# Patient Record
Sex: Male | Born: 1975 | Race: White | Hispanic: No | Smoking: Current every day smoker
Health system: Southern US, Community
[De-identification: ages and names within clinical notes are randomized; demographics above are authoritative.]

## PROBLEM LIST (undated history)

## (undated) DIAGNOSIS — R569 Unspecified convulsions: Secondary | ICD-10-CM

## (undated) DIAGNOSIS — K219 Gastro-esophageal reflux disease without esophagitis: Secondary | ICD-10-CM

## (undated) DIAGNOSIS — F32A Depression, unspecified: Secondary | ICD-10-CM

## (undated) DIAGNOSIS — Q400 Congenital hypertrophic pyloric stenosis: Secondary | ICD-10-CM

## (undated) DIAGNOSIS — T7840XA Allergy, unspecified, initial encounter: Secondary | ICD-10-CM

## (undated) DIAGNOSIS — G8929 Other chronic pain: Secondary | ICD-10-CM

## (undated) DIAGNOSIS — I10 Essential (primary) hypertension: Secondary | ICD-10-CM

## (undated) DIAGNOSIS — Z8719 Personal history of other diseases of the digestive system: Secondary | ICD-10-CM

## (undated) DIAGNOSIS — M1990 Unspecified osteoarthritis, unspecified site: Secondary | ICD-10-CM

## (undated) DIAGNOSIS — M549 Dorsalgia, unspecified: Secondary | ICD-10-CM

## (undated) DIAGNOSIS — F419 Anxiety disorder, unspecified: Secondary | ICD-10-CM

## (undated) DIAGNOSIS — M5430 Sciatica, unspecified side: Secondary | ICD-10-CM

## (undated) HISTORY — DX: Anxiety disorder, unspecified: F41.9

## (undated) HISTORY — PX: BACK SURGERY: SHX140

## (undated) HISTORY — PX: COLONOSCOPY WITH ESOPHAGOGASTRODUODENOSCOPY (EGD): SHX5779

## (undated) HISTORY — DX: Depression, unspecified: F32.A

## (undated) HISTORY — PX: APPENDECTOMY: SHX54

## (undated) HISTORY — DX: Other chronic pain: G89.29

## (undated) HISTORY — DX: Essential (primary) hypertension: I10

## (undated) HISTORY — DX: Congenital hypertrophic pyloric stenosis: Q40.0

## (undated) HISTORY — PX: OTHER SURGICAL HISTORY: SHX169

## (undated) HISTORY — DX: Unspecified osteoarthritis, unspecified site: M19.90

## (undated) HISTORY — DX: Allergy, unspecified, initial encounter: T78.40XA

## (undated) HISTORY — DX: Unspecified convulsions: R56.9

## (undated) HISTORY — PX: COLONOSCOPY: SHX174

## (undated) HISTORY — PX: UPPER GASTROINTESTINAL ENDOSCOPY: SHX188

## (undated) HISTORY — DX: Sciatica, unspecified side: M54.30

---

## 1998-11-06 ENCOUNTER — Encounter: Admission: RE | Admit: 1998-11-06 | Discharge: 1998-11-06 | Payer: Self-pay | Admitting: Family Medicine

## 1999-01-01 ENCOUNTER — Encounter: Admission: RE | Admit: 1999-01-01 | Discharge: 1999-01-01 | Payer: Self-pay | Admitting: Sports Medicine

## 1999-07-30 ENCOUNTER — Other Ambulatory Visit: Admission: RE | Admit: 1999-07-30 | Discharge: 1999-07-30 | Payer: Self-pay | Admitting: Gastroenterology

## 1999-07-30 ENCOUNTER — Encounter (INDEPENDENT_AMBULATORY_CARE_PROVIDER_SITE_OTHER): Payer: Self-pay | Admitting: Specialist

## 1999-09-15 ENCOUNTER — Ambulatory Visit (HOSPITAL_COMMUNITY): Admission: RE | Admit: 1999-09-15 | Discharge: 1999-09-15 | Payer: Self-pay | Admitting: Gastroenterology

## 1999-09-15 ENCOUNTER — Encounter: Payer: Self-pay | Admitting: Gastroenterology

## 2000-09-06 ENCOUNTER — Encounter: Admission: RE | Admit: 2000-09-06 | Discharge: 2000-09-06 | Payer: Self-pay | Admitting: Family Medicine

## 2000-10-04 ENCOUNTER — Emergency Department (HOSPITAL_COMMUNITY): Admission: EM | Admit: 2000-10-04 | Discharge: 2000-10-04 | Payer: Self-pay | Admitting: Emergency Medicine

## 2000-10-04 ENCOUNTER — Encounter: Admission: RE | Admit: 2000-10-04 | Discharge: 2000-10-04 | Payer: Self-pay | Admitting: Family Medicine

## 2001-01-27 ENCOUNTER — Encounter: Admission: RE | Admit: 2001-01-27 | Discharge: 2001-01-27 | Payer: Self-pay | Admitting: Family Medicine

## 2001-02-15 ENCOUNTER — Encounter: Admission: RE | Admit: 2001-02-15 | Discharge: 2001-02-15 | Payer: Self-pay | Admitting: Sports Medicine

## 2001-02-22 ENCOUNTER — Encounter: Admission: RE | Admit: 2001-02-22 | Discharge: 2001-02-22 | Payer: Self-pay | Admitting: Sports Medicine

## 2001-03-14 ENCOUNTER — Encounter: Admission: RE | Admit: 2001-03-14 | Discharge: 2001-03-14 | Payer: Self-pay | Admitting: Family Medicine

## 2001-08-26 ENCOUNTER — Emergency Department (HOSPITAL_COMMUNITY): Admission: EM | Admit: 2001-08-26 | Discharge: 2001-08-26 | Payer: Self-pay | Admitting: Emergency Medicine

## 2001-08-26 ENCOUNTER — Encounter: Payer: Self-pay | Admitting: Emergency Medicine

## 2001-09-08 ENCOUNTER — Encounter: Payer: Self-pay | Admitting: Emergency Medicine

## 2001-09-08 ENCOUNTER — Emergency Department (HOSPITAL_COMMUNITY): Admission: EM | Admit: 2001-09-08 | Discharge: 2001-09-08 | Payer: Self-pay | Admitting: Emergency Medicine

## 2002-03-21 ENCOUNTER — Encounter: Admission: RE | Admit: 2002-03-21 | Discharge: 2002-03-21 | Payer: Self-pay | Admitting: Family Medicine

## 2002-04-07 ENCOUNTER — Encounter: Admission: RE | Admit: 2002-04-07 | Discharge: 2002-04-07 | Payer: Self-pay | Admitting: Family Medicine

## 2002-04-16 ENCOUNTER — Emergency Department (HOSPITAL_COMMUNITY): Admission: EM | Admit: 2002-04-16 | Discharge: 2002-04-16 | Payer: Self-pay | Admitting: Emergency Medicine

## 2002-04-17 ENCOUNTER — Encounter: Admission: RE | Admit: 2002-04-17 | Discharge: 2002-04-17 | Payer: Self-pay | Admitting: Family Medicine

## 2002-04-24 ENCOUNTER — Encounter: Admission: RE | Admit: 2002-04-24 | Discharge: 2002-04-24 | Payer: Self-pay | Admitting: Family Medicine

## 2002-05-02 ENCOUNTER — Encounter: Admission: RE | Admit: 2002-05-02 | Discharge: 2002-05-02 | Payer: Self-pay | Admitting: Family Medicine

## 2002-06-06 ENCOUNTER — Encounter: Payer: Self-pay | Admitting: Emergency Medicine

## 2002-06-06 ENCOUNTER — Emergency Department (HOSPITAL_COMMUNITY): Admission: EM | Admit: 2002-06-06 | Discharge: 2002-06-06 | Payer: Self-pay | Admitting: Emergency Medicine

## 2002-10-12 ENCOUNTER — Emergency Department (HOSPITAL_COMMUNITY): Admission: EM | Admit: 2002-10-12 | Discharge: 2002-10-12 | Payer: Self-pay | Admitting: Emergency Medicine

## 2003-02-10 ENCOUNTER — Encounter: Payer: Self-pay | Admitting: Emergency Medicine

## 2003-02-10 ENCOUNTER — Emergency Department (HOSPITAL_COMMUNITY): Admission: EM | Admit: 2003-02-10 | Discharge: 2003-02-10 | Payer: Self-pay

## 2003-02-13 ENCOUNTER — Encounter: Admission: RE | Admit: 2003-02-13 | Discharge: 2003-02-13 | Payer: Self-pay | Admitting: Family Medicine

## 2003-03-02 ENCOUNTER — Encounter: Admission: RE | Admit: 2003-03-02 | Discharge: 2003-03-02 | Payer: Self-pay | Admitting: Family Medicine

## 2003-04-02 ENCOUNTER — Encounter: Admission: RE | Admit: 2003-04-02 | Discharge: 2003-04-02 | Payer: Self-pay | Admitting: Family Medicine

## 2003-04-13 ENCOUNTER — Encounter: Payer: Self-pay | Admitting: *Deleted

## 2003-04-13 ENCOUNTER — Encounter: Admission: RE | Admit: 2003-04-13 | Discharge: 2003-04-13 | Payer: Self-pay | Admitting: *Deleted

## 2003-05-25 ENCOUNTER — Encounter: Payer: Self-pay | Admitting: Neurosurgery

## 2003-05-25 ENCOUNTER — Ambulatory Visit (HOSPITAL_COMMUNITY): Admission: RE | Admit: 2003-05-25 | Discharge: 2003-05-25 | Payer: Self-pay | Admitting: Neurosurgery

## 2003-06-01 ENCOUNTER — Encounter: Admission: RE | Admit: 2003-06-01 | Discharge: 2003-06-01 | Payer: Self-pay | Admitting: Family Medicine

## 2003-06-28 ENCOUNTER — Encounter: Payer: Self-pay | Admitting: Neurosurgery

## 2003-06-28 ENCOUNTER — Encounter: Payer: Self-pay | Admitting: Diagnostic Radiology

## 2003-06-28 ENCOUNTER — Encounter: Admission: RE | Admit: 2003-06-28 | Discharge: 2003-06-28 | Payer: Self-pay | Admitting: Neurosurgery

## 2005-03-27 ENCOUNTER — Ambulatory Visit: Payer: Self-pay | Admitting: Sports Medicine

## 2005-04-24 ENCOUNTER — Ambulatory Visit: Payer: Self-pay | Admitting: Family Medicine

## 2005-05-15 ENCOUNTER — Ambulatory Visit: Payer: Self-pay | Admitting: Family Medicine

## 2005-06-12 ENCOUNTER — Encounter
Admission: RE | Admit: 2005-06-12 | Discharge: 2005-09-10 | Payer: Self-pay | Admitting: Physical Medicine & Rehabilitation

## 2005-06-12 ENCOUNTER — Ambulatory Visit: Payer: Self-pay | Admitting: Physical Medicine & Rehabilitation

## 2005-06-16 ENCOUNTER — Ambulatory Visit: Payer: Self-pay | Admitting: Family Medicine

## 2005-07-14 ENCOUNTER — Ambulatory Visit: Payer: Self-pay | Admitting: Family Medicine

## 2005-08-06 ENCOUNTER — Ambulatory Visit: Payer: Self-pay | Admitting: Family Medicine

## 2005-09-07 ENCOUNTER — Ambulatory Visit: Payer: Self-pay | Admitting: Family Medicine

## 2005-10-08 ENCOUNTER — Ambulatory Visit: Payer: Self-pay | Admitting: Family Medicine

## 2005-12-08 ENCOUNTER — Ambulatory Visit: Payer: Self-pay | Admitting: Family Medicine

## 2006-05-19 ENCOUNTER — Emergency Department (HOSPITAL_COMMUNITY): Admission: EM | Admit: 2006-05-19 | Discharge: 2006-05-19 | Payer: Self-pay | Admitting: Emergency Medicine

## 2006-06-01 ENCOUNTER — Ambulatory Visit: Payer: Self-pay | Admitting: Family Medicine

## 2006-06-13 ENCOUNTER — Emergency Department: Payer: Self-pay | Admitting: Emergency Medicine

## 2006-06-17 ENCOUNTER — Emergency Department: Payer: Self-pay | Admitting: Internal Medicine

## 2006-07-22 ENCOUNTER — Ambulatory Visit: Payer: Self-pay | Admitting: Sports Medicine

## 2006-09-24 ENCOUNTER — Ambulatory Visit: Payer: Self-pay | Admitting: Family Medicine

## 2006-10-03 ENCOUNTER — Emergency Department (HOSPITAL_COMMUNITY): Admission: EM | Admit: 2006-10-03 | Discharge: 2006-10-03 | Payer: Self-pay | Admitting: Family Medicine

## 2006-10-06 ENCOUNTER — Ambulatory Visit: Payer: Self-pay | Admitting: Sports Medicine

## 2006-10-18 ENCOUNTER — Ambulatory Visit: Payer: Self-pay | Admitting: Family Medicine

## 2006-10-21 ENCOUNTER — Encounter (INDEPENDENT_AMBULATORY_CARE_PROVIDER_SITE_OTHER): Payer: Self-pay | Admitting: Family Medicine

## 2006-10-21 ENCOUNTER — Ambulatory Visit: Payer: Self-pay | Admitting: Family Medicine

## 2006-12-19 ENCOUNTER — Emergency Department (HOSPITAL_COMMUNITY): Admission: EM | Admit: 2006-12-19 | Discharge: 2006-12-19 | Payer: Self-pay | Admitting: Emergency Medicine

## 2007-01-05 ENCOUNTER — Emergency Department: Payer: Self-pay | Admitting: Emergency Medicine

## 2007-01-19 ENCOUNTER — Ambulatory Visit: Payer: Self-pay | Admitting: Family Medicine

## 2007-02-04 ENCOUNTER — Observation Stay (HOSPITAL_COMMUNITY): Admission: EM | Admit: 2007-02-04 | Discharge: 2007-02-05 | Payer: Self-pay | Admitting: Emergency Medicine

## 2007-02-04 ENCOUNTER — Ambulatory Visit: Payer: Self-pay | Admitting: Internal Medicine

## 2007-02-07 ENCOUNTER — Ambulatory Visit: Payer: Self-pay | Admitting: Family Medicine

## 2007-02-07 ENCOUNTER — Encounter (INDEPENDENT_AMBULATORY_CARE_PROVIDER_SITE_OTHER): Payer: Self-pay | Admitting: Family Medicine

## 2007-02-07 LAB — CONVERTED CEMR LAB
BUN: 17 mg/dL (ref 6–23)
CO2: 27 meq/L (ref 19–32)
Chloride: 102 meq/L (ref 96–112)
Creatinine, Ser: 0.98 mg/dL (ref 0.40–1.50)
Glucose, Bld: 93 mg/dL (ref 70–99)

## 2007-03-02 ENCOUNTER — Telehealth: Payer: Self-pay | Admitting: *Deleted

## 2007-03-10 ENCOUNTER — Telehealth: Payer: Self-pay | Admitting: Family Medicine

## 2007-03-21 ENCOUNTER — Telehealth: Payer: Self-pay | Admitting: *Deleted

## 2007-03-22 ENCOUNTER — Ambulatory Visit: Payer: Self-pay

## 2007-03-22 DIAGNOSIS — IMO0002 Reserved for concepts with insufficient information to code with codable children: Secondary | ICD-10-CM

## 2007-03-23 ENCOUNTER — Telehealth (INDEPENDENT_AMBULATORY_CARE_PROVIDER_SITE_OTHER): Payer: Self-pay | Admitting: Family Medicine

## 2007-04-21 ENCOUNTER — Encounter (INDEPENDENT_AMBULATORY_CARE_PROVIDER_SITE_OTHER): Payer: Self-pay | Admitting: Family Medicine

## 2007-04-21 ENCOUNTER — Ambulatory Visit: Payer: Self-pay | Admitting: Family Medicine

## 2007-04-21 DIAGNOSIS — R569 Unspecified convulsions: Secondary | ICD-10-CM | POA: Insufficient documentation

## 2007-04-21 LAB — CONVERTED CEMR LAB
Albumin: 5.2 g/dL (ref 3.5–5.2)
Alkaline Phosphatase: 56 units/L (ref 39–117)
BUN: 16 mg/dL (ref 6–23)
Calcium: 10.1 mg/dL (ref 8.4–10.5)
Creatinine, Ser: 0.93 mg/dL (ref 0.40–1.50)
Glucose, Bld: 90 mg/dL (ref 70–99)
Ketones, urine, test strip: NEGATIVE
Nitrite: NEGATIVE
Potassium: 4.2 meq/L (ref 3.5–5.3)
Urobilinogen, UA: 0.2
WBC Urine, dipstick: NEGATIVE

## 2007-04-22 ENCOUNTER — Telehealth: Payer: Self-pay | Admitting: *Deleted

## 2007-04-25 ENCOUNTER — Telehealth: Payer: Self-pay | Admitting: *Deleted

## 2007-04-26 ENCOUNTER — Ambulatory Visit (HOSPITAL_COMMUNITY): Admission: RE | Admit: 2007-04-26 | Discharge: 2007-04-26 | Payer: Self-pay | Admitting: Family Medicine

## 2007-04-26 ENCOUNTER — Encounter (INDEPENDENT_AMBULATORY_CARE_PROVIDER_SITE_OTHER): Payer: Self-pay | Admitting: Family Medicine

## 2007-05-02 ENCOUNTER — Telehealth: Payer: Self-pay | Admitting: *Deleted

## 2007-05-20 ENCOUNTER — Telehealth (INDEPENDENT_AMBULATORY_CARE_PROVIDER_SITE_OTHER): Payer: Self-pay | Admitting: Family Medicine

## 2007-05-26 ENCOUNTER — Ambulatory Visit: Payer: Self-pay | Admitting: Family Medicine

## 2007-06-21 ENCOUNTER — Telehealth: Payer: Self-pay | Admitting: *Deleted

## 2007-07-27 ENCOUNTER — Ambulatory Visit: Payer: Self-pay | Admitting: Family Medicine

## 2007-10-11 ENCOUNTER — Telehealth: Payer: Self-pay | Admitting: *Deleted

## 2007-10-12 ENCOUNTER — Telehealth (INDEPENDENT_AMBULATORY_CARE_PROVIDER_SITE_OTHER): Payer: Self-pay | Admitting: *Deleted

## 2007-10-12 ENCOUNTER — Ambulatory Visit: Payer: Self-pay | Admitting: Family Medicine

## 2007-10-23 ENCOUNTER — Emergency Department (HOSPITAL_COMMUNITY): Admission: EM | Admit: 2007-10-23 | Discharge: 2007-10-23 | Payer: Self-pay | Admitting: Emergency Medicine

## 2007-11-10 ENCOUNTER — Ambulatory Visit: Payer: Self-pay | Admitting: Family Medicine

## 2007-11-25 ENCOUNTER — Telehealth (INDEPENDENT_AMBULATORY_CARE_PROVIDER_SITE_OTHER): Payer: Self-pay | Admitting: *Deleted

## 2007-11-26 ENCOUNTER — Emergency Department (HOSPITAL_COMMUNITY): Admission: EM | Admit: 2007-11-26 | Discharge: 2007-11-26 | Payer: Self-pay | Admitting: Emergency Medicine

## 2007-12-08 ENCOUNTER — Emergency Department (HOSPITAL_COMMUNITY): Admission: EM | Admit: 2007-12-08 | Discharge: 2007-12-08 | Payer: Self-pay | Admitting: Family Medicine

## 2007-12-08 ENCOUNTER — Telehealth: Payer: Self-pay | Admitting: Psychology

## 2007-12-16 ENCOUNTER — Telehealth: Payer: Self-pay | Admitting: *Deleted

## 2007-12-19 ENCOUNTER — Ambulatory Visit: Payer: Self-pay | Admitting: Family Medicine

## 2008-01-02 ENCOUNTER — Ambulatory Visit: Payer: Self-pay | Admitting: Family Medicine

## 2008-01-09 ENCOUNTER — Ambulatory Visit: Payer: Self-pay | Admitting: Psychology

## 2008-01-09 DIAGNOSIS — F39 Unspecified mood [affective] disorder: Secondary | ICD-10-CM | POA: Insufficient documentation

## 2008-01-18 ENCOUNTER — Ambulatory Visit: Payer: Self-pay | Admitting: Family Medicine

## 2008-01-18 DIAGNOSIS — F17200 Nicotine dependence, unspecified, uncomplicated: Secondary | ICD-10-CM

## 2008-01-22 ENCOUNTER — Telehealth: Payer: Self-pay | Admitting: Family Medicine

## 2008-01-23 ENCOUNTER — Ambulatory Visit: Payer: Self-pay | Admitting: Psychology

## 2008-01-23 ENCOUNTER — Emergency Department (HOSPITAL_COMMUNITY): Admission: EM | Admit: 2008-01-23 | Discharge: 2008-01-23 | Payer: Self-pay | Admitting: Emergency Medicine

## 2008-01-23 ENCOUNTER — Telehealth (INDEPENDENT_AMBULATORY_CARE_PROVIDER_SITE_OTHER): Payer: Self-pay | Admitting: *Deleted

## 2008-02-03 ENCOUNTER — Ambulatory Visit: Payer: Self-pay | Admitting: Family Medicine

## 2008-02-09 ENCOUNTER — Encounter (INDEPENDENT_AMBULATORY_CARE_PROVIDER_SITE_OTHER): Payer: Self-pay | Admitting: Family Medicine

## 2008-02-15 ENCOUNTER — Telehealth: Payer: Self-pay | Admitting: Psychology

## 2008-02-29 ENCOUNTER — Ambulatory Visit: Payer: Self-pay | Admitting: Family Medicine

## 2008-03-08 ENCOUNTER — Ambulatory Visit: Payer: Self-pay | Admitting: Sports Medicine

## 2008-03-19 ENCOUNTER — Encounter (INDEPENDENT_AMBULATORY_CARE_PROVIDER_SITE_OTHER): Payer: Self-pay | Admitting: Family Medicine

## 2008-03-19 ENCOUNTER — Ambulatory Visit: Payer: Self-pay | Admitting: Family Medicine

## 2008-03-22 ENCOUNTER — Telehealth (INDEPENDENT_AMBULATORY_CARE_PROVIDER_SITE_OTHER): Payer: Self-pay | Admitting: Family Medicine

## 2008-03-22 ENCOUNTER — Ambulatory Visit: Payer: Self-pay | Admitting: Family Medicine

## 2008-03-30 ENCOUNTER — Telehealth (INDEPENDENT_AMBULATORY_CARE_PROVIDER_SITE_OTHER): Payer: Self-pay | Admitting: Family Medicine

## 2008-04-03 ENCOUNTER — Encounter (INDEPENDENT_AMBULATORY_CARE_PROVIDER_SITE_OTHER): Payer: Self-pay | Admitting: *Deleted

## 2008-04-18 ENCOUNTER — Encounter (INDEPENDENT_AMBULATORY_CARE_PROVIDER_SITE_OTHER): Payer: Self-pay | Admitting: Family Medicine

## 2008-04-18 ENCOUNTER — Ambulatory Visit: Payer: Self-pay | Admitting: Family Medicine

## 2008-04-18 ENCOUNTER — Ambulatory Visit (HOSPITAL_COMMUNITY): Admission: RE | Admit: 2008-04-18 | Discharge: 2008-04-18 | Payer: Self-pay | Admitting: Family Medicine

## 2008-04-18 LAB — CONVERTED CEMR LAB
Lymphs Abs: 1.7 10*3/uL (ref 0.7–4.0)
MCV: 90.2 fL (ref 78.0–100.0)
Monocytes Relative: 8 % (ref 3–12)
Neutro Abs: 17.4 10*3/uL — ABNORMAL HIGH (ref 1.7–7.7)
Neutrophils Relative %: 84 % — ABNORMAL HIGH (ref 43–77)
RBC: 4.78 M/uL (ref 4.22–5.81)
WBC: 20.7 10*3/uL — ABNORMAL HIGH (ref 4.0–10.5)

## 2008-04-23 ENCOUNTER — Ambulatory Visit: Payer: Self-pay | Admitting: Family Medicine

## 2008-04-26 ENCOUNTER — Telehealth: Payer: Self-pay | Admitting: *Deleted

## 2008-04-27 ENCOUNTER — Telehealth: Payer: Self-pay | Admitting: *Deleted

## 2008-04-27 ENCOUNTER — Telehealth (INDEPENDENT_AMBULATORY_CARE_PROVIDER_SITE_OTHER): Payer: Self-pay | Admitting: Family Medicine

## 2008-04-27 ENCOUNTER — Ambulatory Visit: Payer: Self-pay | Admitting: Family Medicine

## 2008-05-01 ENCOUNTER — Encounter (INDEPENDENT_AMBULATORY_CARE_PROVIDER_SITE_OTHER): Payer: Self-pay | Admitting: *Deleted

## 2008-05-01 ENCOUNTER — Ambulatory Visit: Payer: Self-pay | Admitting: Psychology

## 2008-05-08 ENCOUNTER — Encounter: Payer: Self-pay | Admitting: Psychology

## 2008-05-17 ENCOUNTER — Ambulatory Visit: Payer: Self-pay | Admitting: Sports Medicine

## 2008-05-22 ENCOUNTER — Ambulatory Visit: Payer: Self-pay | Admitting: Family Medicine

## 2008-05-28 ENCOUNTER — Telehealth: Payer: Self-pay | Admitting: *Deleted

## 2008-05-29 ENCOUNTER — Ambulatory Visit: Payer: Self-pay | Admitting: Family Medicine

## 2008-05-29 DIAGNOSIS — F1210 Cannabis abuse, uncomplicated: Secondary | ICD-10-CM | POA: Insufficient documentation

## 2008-06-07 ENCOUNTER — Ambulatory Visit: Payer: Self-pay | Admitting: Psychology

## 2008-06-07 ENCOUNTER — Encounter: Admission: RE | Admit: 2008-06-07 | Discharge: 2008-06-07 | Payer: Self-pay | Admitting: Neurosurgery

## 2008-06-14 ENCOUNTER — Ambulatory Visit: Payer: Self-pay | Admitting: Psychology

## 2008-06-25 ENCOUNTER — Telehealth (INDEPENDENT_AMBULATORY_CARE_PROVIDER_SITE_OTHER): Payer: Self-pay | Admitting: Family Medicine

## 2008-06-28 ENCOUNTER — Ambulatory Visit: Payer: Self-pay | Admitting: Family Medicine

## 2008-07-23 ENCOUNTER — Telehealth (INDEPENDENT_AMBULATORY_CARE_PROVIDER_SITE_OTHER): Payer: Self-pay | Admitting: Family Medicine

## 2008-08-02 ENCOUNTER — Emergency Department (HOSPITAL_COMMUNITY): Admission: EM | Admit: 2008-08-02 | Discharge: 2008-08-02 | Payer: Self-pay | Admitting: Emergency Medicine

## 2008-08-16 ENCOUNTER — Telehealth (INDEPENDENT_AMBULATORY_CARE_PROVIDER_SITE_OTHER): Payer: Self-pay | Admitting: Family Medicine

## 2008-08-16 ENCOUNTER — Emergency Department (HOSPITAL_COMMUNITY): Admission: EM | Admit: 2008-08-16 | Discharge: 2008-08-17 | Payer: Self-pay | Admitting: Emergency Medicine

## 2008-08-20 ENCOUNTER — Telehealth (INDEPENDENT_AMBULATORY_CARE_PROVIDER_SITE_OTHER): Payer: Self-pay | Admitting: Family Medicine

## 2008-09-17 ENCOUNTER — Encounter (INDEPENDENT_AMBULATORY_CARE_PROVIDER_SITE_OTHER): Payer: Self-pay | Admitting: Family Medicine

## 2008-10-08 ENCOUNTER — Emergency Department (HOSPITAL_COMMUNITY): Admission: EM | Admit: 2008-10-08 | Discharge: 2008-10-09 | Payer: Self-pay | Admitting: Emergency Medicine

## 2008-10-15 ENCOUNTER — Telehealth (INDEPENDENT_AMBULATORY_CARE_PROVIDER_SITE_OTHER): Payer: Self-pay | Admitting: Family Medicine

## 2008-10-17 ENCOUNTER — Telehealth: Payer: Self-pay | Admitting: Family Medicine

## 2008-10-17 ENCOUNTER — Emergency Department (HOSPITAL_COMMUNITY): Admission: EM | Admit: 2008-10-17 | Discharge: 2008-10-18 | Payer: Self-pay | Admitting: Emergency Medicine

## 2008-10-18 ENCOUNTER — Encounter (INDEPENDENT_AMBULATORY_CARE_PROVIDER_SITE_OTHER): Payer: Self-pay | Admitting: *Deleted

## 2008-10-18 ENCOUNTER — Ambulatory Visit: Payer: Self-pay | Admitting: Family Medicine

## 2008-11-13 ENCOUNTER — Telehealth (INDEPENDENT_AMBULATORY_CARE_PROVIDER_SITE_OTHER): Payer: Self-pay | Admitting: Family Medicine

## 2008-11-14 ENCOUNTER — Telehealth (INDEPENDENT_AMBULATORY_CARE_PROVIDER_SITE_OTHER): Payer: Self-pay | Admitting: *Deleted

## 2008-11-28 ENCOUNTER — Emergency Department (HOSPITAL_COMMUNITY): Admission: EM | Admit: 2008-11-28 | Discharge: 2008-11-28 | Payer: Self-pay | Admitting: Emergency Medicine

## 2008-12-10 ENCOUNTER — Telehealth (INDEPENDENT_AMBULATORY_CARE_PROVIDER_SITE_OTHER): Payer: Self-pay | Admitting: Family Medicine

## 2009-01-09 ENCOUNTER — Telehealth (INDEPENDENT_AMBULATORY_CARE_PROVIDER_SITE_OTHER): Payer: Self-pay | Admitting: Family Medicine

## 2009-02-11 ENCOUNTER — Telehealth (INDEPENDENT_AMBULATORY_CARE_PROVIDER_SITE_OTHER): Payer: Self-pay | Admitting: Family Medicine

## 2009-03-05 ENCOUNTER — Telehealth (INDEPENDENT_AMBULATORY_CARE_PROVIDER_SITE_OTHER): Payer: Self-pay | Admitting: Family Medicine

## 2009-03-08 ENCOUNTER — Telehealth (INDEPENDENT_AMBULATORY_CARE_PROVIDER_SITE_OTHER): Payer: Self-pay | Admitting: Family Medicine

## 2009-04-09 ENCOUNTER — Telehealth (INDEPENDENT_AMBULATORY_CARE_PROVIDER_SITE_OTHER): Payer: Self-pay | Admitting: Family Medicine

## 2009-05-07 ENCOUNTER — Telehealth (INDEPENDENT_AMBULATORY_CARE_PROVIDER_SITE_OTHER): Payer: Self-pay | Admitting: Family Medicine

## 2009-05-07 ENCOUNTER — Ambulatory Visit: Payer: Self-pay | Admitting: Family Medicine

## 2009-05-08 ENCOUNTER — Encounter (INDEPENDENT_AMBULATORY_CARE_PROVIDER_SITE_OTHER): Payer: Self-pay | Admitting: Family Medicine

## 2009-05-08 ENCOUNTER — Ambulatory Visit: Payer: Self-pay | Admitting: Family Medicine

## 2009-05-09 ENCOUNTER — Telehealth (INDEPENDENT_AMBULATORY_CARE_PROVIDER_SITE_OTHER): Payer: Self-pay | Admitting: Family Medicine

## 2009-05-10 ENCOUNTER — Ambulatory Visit: Payer: Self-pay | Admitting: Family Medicine

## 2009-05-10 ENCOUNTER — Encounter (INDEPENDENT_AMBULATORY_CARE_PROVIDER_SITE_OTHER): Payer: Self-pay | Admitting: Family Medicine

## 2009-05-14 ENCOUNTER — Encounter (INDEPENDENT_AMBULATORY_CARE_PROVIDER_SITE_OTHER): Payer: Self-pay | Admitting: Family Medicine

## 2009-05-14 ENCOUNTER — Telehealth (INDEPENDENT_AMBULATORY_CARE_PROVIDER_SITE_OTHER): Payer: Self-pay | Admitting: Family Medicine

## 2009-05-14 ENCOUNTER — Ambulatory Visit: Payer: Self-pay | Admitting: Family Medicine

## 2009-05-16 ENCOUNTER — Ambulatory Visit: Payer: Self-pay | Admitting: Family Medicine

## 2009-05-16 ENCOUNTER — Telehealth (INDEPENDENT_AMBULATORY_CARE_PROVIDER_SITE_OTHER): Payer: Self-pay | Admitting: Family Medicine

## 2009-05-22 ENCOUNTER — Encounter (INDEPENDENT_AMBULATORY_CARE_PROVIDER_SITE_OTHER): Payer: Self-pay | Admitting: Family Medicine

## 2009-05-22 ENCOUNTER — Ambulatory Visit (HOSPITAL_COMMUNITY): Admission: RE | Admit: 2009-05-22 | Discharge: 2009-05-22 | Payer: Self-pay | Admitting: Family Medicine

## 2009-05-22 ENCOUNTER — Ambulatory Visit: Payer: Self-pay | Admitting: Vascular Surgery

## 2009-05-22 ENCOUNTER — Ambulatory Visit: Payer: Self-pay | Admitting: Family Medicine

## 2009-05-24 ENCOUNTER — Telehealth (INDEPENDENT_AMBULATORY_CARE_PROVIDER_SITE_OTHER): Payer: Self-pay | Admitting: Family Medicine

## 2009-06-05 ENCOUNTER — Encounter (INDEPENDENT_AMBULATORY_CARE_PROVIDER_SITE_OTHER): Payer: Self-pay | Admitting: Family Medicine

## 2009-06-17 ENCOUNTER — Telehealth: Payer: Self-pay | Admitting: Sports Medicine

## 2009-07-22 ENCOUNTER — Telehealth: Payer: Self-pay | Admitting: Family Medicine

## 2009-08-19 ENCOUNTER — Telehealth: Payer: Self-pay | Admitting: Family Medicine

## 2009-09-16 ENCOUNTER — Telehealth: Payer: Self-pay | Admitting: Family Medicine

## 2009-10-14 ENCOUNTER — Telehealth: Payer: Self-pay | Admitting: Family Medicine

## 2009-11-11 ENCOUNTER — Telehealth: Payer: Self-pay | Admitting: Family Medicine

## 2009-11-14 ENCOUNTER — Ambulatory Visit: Payer: Self-pay | Admitting: Family Medicine

## 2009-12-03 ENCOUNTER — Telehealth: Payer: Self-pay | Admitting: Family Medicine

## 2009-12-03 ENCOUNTER — Ambulatory Visit: Payer: Self-pay | Admitting: Family Medicine

## 2009-12-30 ENCOUNTER — Telehealth: Payer: Self-pay | Admitting: Family Medicine

## 2010-01-28 ENCOUNTER — Telehealth: Payer: Self-pay | Admitting: Family Medicine

## 2010-02-17 ENCOUNTER — Ambulatory Visit: Payer: Self-pay | Admitting: Family Medicine

## 2010-03-13 ENCOUNTER — Telehealth: Payer: Self-pay | Admitting: Family Medicine

## 2010-04-09 ENCOUNTER — Telehealth: Payer: Self-pay | Admitting: Family Medicine

## 2010-05-09 ENCOUNTER — Telehealth: Payer: Self-pay | Admitting: Family Medicine

## 2010-06-09 ENCOUNTER — Encounter: Payer: Self-pay | Admitting: Family Medicine

## 2010-07-07 ENCOUNTER — Telehealth: Payer: Self-pay | Admitting: Family Medicine

## 2010-08-06 ENCOUNTER — Telehealth: Payer: Self-pay | Admitting: Family Medicine

## 2010-09-02 ENCOUNTER — Telehealth: Payer: Self-pay | Admitting: Family Medicine

## 2010-10-01 ENCOUNTER — Telehealth: Payer: Self-pay | Admitting: Family Medicine

## 2010-10-29 ENCOUNTER — Telehealth: Payer: Self-pay | Admitting: Family Medicine

## 2010-10-29 ENCOUNTER — Encounter: Payer: Self-pay | Admitting: Family Medicine

## 2010-11-27 ENCOUNTER — Telehealth: Payer: Self-pay | Admitting: Family Medicine

## 2010-11-27 ENCOUNTER — Emergency Department (HOSPITAL_COMMUNITY)
Admission: EM | Admit: 2010-11-27 | Discharge: 2010-11-27 | Payer: Self-pay | Source: Home / Self Care | Admitting: Family Medicine

## 2010-12-25 ENCOUNTER — Telehealth: Payer: Self-pay | Admitting: Family Medicine

## 2011-01-13 ENCOUNTER — Ambulatory Visit: Admission: RE | Admit: 2011-01-13 | Discharge: 2011-01-13 | Payer: Self-pay | Source: Home / Self Care

## 2011-01-13 DIAGNOSIS — R030 Elevated blood-pressure reading, without diagnosis of hypertension: Secondary | ICD-10-CM | POA: Insufficient documentation

## 2011-01-13 DIAGNOSIS — F4321 Adjustment disorder with depressed mood: Secondary | ICD-10-CM | POA: Insufficient documentation

## 2011-01-13 DIAGNOSIS — G894 Chronic pain syndrome: Secondary | ICD-10-CM | POA: Insufficient documentation

## 2011-01-27 NOTE — Progress Notes (Signed)
  Phone Note Call from Patient   Caller: Patient Call For: [phone] or [phone] (cell) Summary of Call: Dianna Rossetti, need rx for his Vicodan and want a rx for Flexerill and Levert the son need rx for Methylphenidate  Initial call taken by: Abundio Miu,  October 01, 2010 8:59 AM    New/Updated Medications: FLEXERIL 10 MG TABS (CYCLOBENZAPRINE HCL) 1 by mouth three times a day as needed spasm Prescriptions: VICODIN 5/325 1-2 tabs every six hours as needed for pain  #100 x 0   Entered and Authorized by:   Milinda Antis MD   Signed by:   Milinda Antis MD on 10/02/2010   Method used:   Handwritten   RxID:   [id number] FLEXERIL 10 MG TABS (CYCLOBENZAPRINE HCL) 1 by mouth three times a day as needed spasm  #20 x 0   Entered and Authorized by:   Milinda Antis MD   Signed by:   Milinda Antis MD on 10/02/2010   Method used:   Handwritten   RxID:   [id number]   Appended Document:  Given short course of muscle relaxant as he has had in the past for chronic pains. If more needed he must  come in for an office vsit. Message written on script and given to patient

## 2011-01-27 NOTE — Progress Notes (Signed)
Summary: Rx Req  Phone Note Refill Request Call back at Home Phone [phone] Message from:  Patient  Refills Requested: Medication #1:  VICODIN 5/325 1-2 tabs every six hours as needed for pain PLEASE CALL WHEN READY TO PICK UP.  Initial call taken by: Clydell Hakim,  January 28, 2010 8:42 AM  Follow-up for Phone Call        wll forward to MD. Follow-up by: Theresia Lo RN,  January 28, 2010 9:58 AM  Additional Follow-up for Phone Call Additional follow up Details #1::        Please call pt and let him know script is at the front desk. He must make an appt for any future refills. I only have him half of the amount Additional Follow-up by: Milinda Antis MD,  January 28, 2010 1:40 PM    Prescriptions: Haskell Flirt 5/325 1-2 tabs every six hours as needed for pain  #50 x 0   Entered and Authorized by:   Milinda Antis MD   Signed by:   Milinda Antis MD on 01/28/2010   Method used:   Handwritten   RxID:   [id number]  Pt given half of amount for month, as he needs to schedule an appt, which I told him 2 months ago   attempted calling patient . no answer. Theresia Lo RN  January 28, 2010 5:27 PM

## 2011-01-27 NOTE — Progress Notes (Signed)
Summary: refill- UDS next visit on pain contract  Phone Note Refill Request Call back at Home Phone [phone] Message from:  Patient  Refills Requested: Medication #1:  VICODIN 5/325 1-2 tabs every six hours as needed for pain. Please call when ready  Initial call taken by: De Nurse,  July 07, 2010 9:11 AM  Follow-up for Phone Call        pt is calling again about meds Follow-up by: De Nurse,  July 08, 2010 2:05 PM    Prescriptions: VICODIN 5/325 1-2 tabs every six hours as needed for pain  #100 x 0   Entered and Authorized by:   Milinda Antis MD   Signed by:   Milinda Antis MD on 07/09/2010   Method used:   Handwritten   RxID:   [id number]  Note to self UDS needed next visit

## 2011-01-27 NOTE — Letter (Signed)
Summary: Generic Letter  [facility]  [address]   [place], [place] [address]   Phone: [phone]  Fax: [phone]    10/29/2010  [facility] [address] [place], [place]  [address]  Dear Mr. FRITZE,   Bonita Quin are overdue for a visit, you will need lab work done, please schedule a morning appointment. I will not be able to continue to fill your medications if you are not seen. I have given you a 30 day supply of your pain medication only. Please schedule a visit within the next 30 days.       Sincerely,   Milinda Antis MD  Appended Document: Generic Letter Spoke with pt at his daughter appt 1 weeek ago- late entry. He will need to reschedule for Jan, secondary to finances, told him I will extend meds until that time.

## 2011-01-27 NOTE — Progress Notes (Signed)
 Summary: refill  Phone Note Refill Request Call back at [facility] Phone [phone] Call back at Work Phone [phone] Message from:  Patient  Refills Requested: Medication #1:  VICODIN 5/325 1-2 tabs every six hours as needed for pain. Please call when ready  Initial call taken by: Karna Seminole,  June 17, 2009 9:07 AM  Follow-up for Phone Call        Script written and printed, will be in an envelope at the front, do not give him this script until next week,  ~06/24/09. Follow-up by: Debby Petties MD,  June 17, 2009 11:59 AM      Prescriptions: VICODIN 5/325 1-2 tabs every six hours as needed for pain  #100 x 0   Entered and Authorized by:   Debby Petties MD   Signed by:   Debby Petties MD on 06/17/2009   Method used:   Handwritten   RxID:   [id number]

## 2011-01-27 NOTE — Miscellaneous (Signed)
Summary: rx  Prescriptions: VICODIN 5/325 1-2 tabs every six hours as needed for pain  #100 x 0   Entered and Authorized by:   Milinda Antis MD   Signed by:   Milinda Antis MD on 06/09/2010   Method used:   Handwritten   RxID:   [id number]  Patient needs refill of Vicodin.  Please call him when ready. Bradly Bienenstock  June 09, 2010 9:58 AM
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
## 2011-01-27 NOTE — Progress Notes (Signed)
Summary: Rx and MRI  Phone Note Call from Patient Call back at Home Phone [phone]   Summary of Call: Pt is needing a refill on hydrocodone and also wants to discuss his last MRI. Initial call taken by: Haydee Salter,  May 28, 2008 8:30 AM  Follow-up for Phone Call        will forward message to MD. Follow-up by: Theresia Lo RN,  May 28, 2008 8:45 AM  Additional Follow-up for Phone Call Additional follow up Details #1::        Will have prescription faxed, and neurosurgeon will order MRI Additional Follow-up by: Levander Campion MD,  May 28, 2008 1:57 PM      Prescriptions: VICODIN 5/325 1-2 tabs every six hours as needed for pain  #100 x 0   Entered and Authorized by:   Levander Campion MD   Signed by:   Levander Campion MD on 05/28/2008   Method used:   Printed then faxed to ...       [facility]  [address]  [id number]       [address]       [place], [place]  [address]       Ph: [phone] or [phone]       Fax: [phone]   RxID:   [id number]  spoke to patient and advised will fax Rx to pharmacy . he wanted to know when his last MRI was and according to our records last MRI was 04/26/07. he will discuss with neurosurgeon about need for another at this time. Theresia Lo RN  May 28, 2008 2:33 PM   .

## 2011-01-27 NOTE — Progress Notes (Signed)
Summary: refill  Phone Note Refill Request Call back at Home Phone [phone] Message from:  Patient  Refills Requested: Medication #1:  VICODIN 5/325 1-2 tabs every six hours as needed for pain Please call when ready  Initial call taken by: De Nurse,  December 30, 2009 9:04 AM  Follow-up for Phone Call        wil forward to MD. Follow-up by: Theresia Lo RN,  December 30, 2009 11:40 AM    Prescriptions: Haskell Flirt 5/325 1-2 tabs every six hours as needed for pain  #100 x 0   Entered and Authorized by:   Milinda Antis MD   Signed by:   Milinda Antis MD on 01/01/2010   Method used:   Handwritten   RxID:   [id number]

## 2011-01-27 NOTE — Progress Notes (Signed)
Summary: refill  Phone Note Refill Request Call back at Home Phone [phone] Message from:  Patient  Refills Requested: Medication #1:  VICODIN 5/325 1-2 tabs every six hours as needed for pain Please call when ready  Initial call taken by: De Nurse,  October 29, 2010 11:15 AM    Prescriptions: Haskell Flirt 5/325 1-2 tabs every six hours as needed for pain  #100 x 0   Entered and Authorized by:   Milinda Antis MD   Signed by:   Milinda Antis MD on 10/29/2010   Method used:   Handwritten   RxID:   [id number]  See note attached.

## 2011-01-27 NOTE — Progress Notes (Signed)
Summary: Rx Req  Phone Note Refill Request Call back at Home Phone [phone] Message from:  Patient  Refills Requested: Medication #1:  VICODIN 5/325 1-2 tabs every six hours as needed for pain. Initial call taken by: Clydell Hakim,  August 06, 2010 10:42 AM    Prescriptions: VICODIN 5/325 1-2 tabs every six hours as needed for pain  #100 x 0   Entered and Authorized by:   Milinda Antis MD   Signed by:   Milinda Antis MD on 08/06/2010   Method used:   Handwritten   RxID:   [id number]

## 2011-01-27 NOTE — Progress Notes (Signed)
Summary: Rx Req  Phone Note Refill Request Call back at Home Phone [phone] Message from:  Patient  Refills Requested: Medication #1:  VICODIN 5/325 1-2 tabs every six hours as needed for pain. PLEASE CALL WHEN READY TO PICK.  Initial call taken by: Clydell Hakim,  March 13, 2010 8:41 AM    Prescriptions: VICODIN 5/325 1-2 tabs every six hours as needed for pain  #100 x 0   Entered and Authorized by:   Milinda Antis MD   Signed by:   Milinda Antis MD on 03/13/2010   Method used:   Handwritten   RxID:   [id number]

## 2011-01-27 NOTE — Progress Notes (Signed)
  Phone Note Refill Request Call back at [phone]   Refills Requested: Medication #1:  VICODIN 5/325 1-2 tabs every six hours as needed for pain need refill.  Will pick up  Initial call taken by: Abundio Miu,  November 27, 2010 9:48 AM

## 2011-01-27 NOTE — Progress Notes (Signed)
Summary: Rx Req  Phone Note Refill Request Call back at Home Phone [phone] Call back at [phone] Message from:  Patient  Refills Requested: Medication #1:  VICODIN 5/325 1-2 tabs every six hours as needed for pain. Initial call taken by: Clydell Hakim,  September 02, 2010 8:52 AM    Prescriptions: VICODIN 5/325 1-2 tabs every six hours as needed for pain  #100 x 0   Entered and Authorized by:   Milinda Antis MD   Signed by:   Milinda Antis MD on 09/02/2010   Method used:   Handwritten   RxID:   [id number]

## 2011-01-27 NOTE — Progress Notes (Signed)
Summary: refill  Phone Note Refill Request Call back at Home Phone [phone] Message from:  Patient  Refills Requested: Medication #1:  VICODIN 5/325 1-2 tabs every six hours as needed for pain. Please call when ready  Initial call taken by: De Nurse,  May 09, 2010 8:41 AM    Prescriptions: Haskell Flirt 5/325 1-2 tabs every six hours as needed for pain  #100 x 0   Entered and Authorized by:   Milinda Antis MD   Signed by:   Milinda Antis MD on 05/12/2010   Method used:   Handwritten   RxID:   [id number]

## 2011-01-27 NOTE — Progress Notes (Signed)
Summary: Rx Req  Phone Note Refill Request Call back at Home Phone [phone] Message from:  Patient  Refills Requested: Medication #1:  VICODIN 5/325 1-2 tabs every six hours as needed for pain. Initial call taken by: Clydell Hakim,  April 09, 2010 8:44 AM    Prescriptions: VICODIN 5/325 1-2 tabs every six hours as needed for pain  #100 x 0   Entered and Authorized by:   Milinda Antis MD   Signed by:   Milinda Antis MD on 04/09/2010   Method used:   Handwritten   RxID:   [id number]

## 2011-01-28 ENCOUNTER — Encounter: Payer: Self-pay | Admitting: *Deleted
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
## 2011-01-29 NOTE — Progress Notes (Signed)
Summary: Rx  Phone Note Refill Request Call back at Home Phone [phone]   Refills Requested: Medication #1:  VICODIN 5/325 1-2 tabs every six hours as needed for pain pt has appt on 1/17  Initial call taken by: Knox Royalty,  December 25, 2010 9:01 AM    Prescriptions: Haskell Flirt 5/325 1-2 tabs every six hours as needed for pain  #60 x 0   Entered and Authorized by:   Milinda Antis MD   Signed by:   Milinda Antis MD on 12/25/2010   Method used:   Handwritten   RxID:   [id number]  Given short course, no further fills if patient does not come to appt [facility] MD  December 25, 2010 10:10 AM

## 2011-02-09 ENCOUNTER — Encounter: Payer: Self-pay | Admitting: Family Medicine

## 2011-02-09 ENCOUNTER — Telehealth: Payer: Self-pay | Admitting: Family Medicine

## 2011-02-09 DIAGNOSIS — M549 Dorsalgia, unspecified: Secondary | ICD-10-CM

## 2011-02-09 DIAGNOSIS — G8929 Other chronic pain: Secondary | ICD-10-CM

## 2011-02-09 MED ORDER — HYDROCODONE-ACETAMINOPHEN 10-500 MG PO TABS: 1.0000 | ORAL_TABLET | Freq: Four times a day (QID) | ORAL | Status: DC | PRN

## 2011-02-09 MED ORDER — CYCLOBENZAPRINE HCL 10 MG PO TABS: 10.0000 mg | ORAL_TABLET | Freq: Three times a day (TID) | ORAL | Status: DC | PRN

## 2011-02-09 NOTE — Telephone Encounter (Signed)
Pt will be given note with prescriptions indicating, no further fills until seen for BP recheck and labs

## 2011-03-09 ENCOUNTER — Telehealth: Payer: Self-pay | Admitting: Family Medicine

## 2011-03-09 NOTE — Telephone Encounter (Signed)
Message forwarded to Dr. Jeanice Lim.

## 2011-03-09 NOTE — Telephone Encounter (Signed)
Has appt for labs on 3/22 - needs refill on Hydrocodone & flexeril

## 2011-03-09 NOTE — Telephone Encounter (Signed)
Will route to Dr. Jeanice Lim

## 2011-03-09 NOTE — Telephone Encounter (Signed)
I am covering for Dr. Jeanice Lim this week. I will be tomorrow and will write Rx for those 2 medications to get him to 3/22 (will need to be picked-up). Dr. Jeanice Lim will decide what to do then. Make sure that he has follow-up with her.

## 2011-03-09 NOTE — Telephone Encounter (Signed)
Called and left Dr. Philis Pique message.

## 2011-03-10 ENCOUNTER — Other Ambulatory Visit: Payer: Self-pay | Admitting: Family Medicine

## 2011-03-10 DIAGNOSIS — M549 Dorsalgia, unspecified: Secondary | ICD-10-CM

## 2011-03-10 MED ORDER — HYDROCODONE-ACETAMINOPHEN 10-500 MG PO TABS: 1.0000 | ORAL_TABLET | Freq: Four times a day (QID) | ORAL | Status: DC | PRN

## 2011-03-10 MED ORDER — CYCLOBENZAPRINE HCL 10 MG PO TABS: 10.0000 mg | ORAL_TABLET | Freq: Three times a day (TID) | ORAL | Status: DC | PRN

## 2011-03-10 NOTE — Telephone Encounter (Signed)
Rx given for 10 days. Ready for pick up.

## 2011-03-19 ENCOUNTER — Ambulatory Visit (INDEPENDENT_AMBULATORY_CARE_PROVIDER_SITE_OTHER): Payer: Self-pay | Admitting: *Deleted

## 2011-03-19 ENCOUNTER — Other Ambulatory Visit: Payer: Self-pay

## 2011-03-19 ENCOUNTER — Other Ambulatory Visit: Payer: Self-pay | Admitting: Family Medicine

## 2011-03-19 VITALS — BP 156/90 | HR 100

## 2011-03-19 DIAGNOSIS — G8929 Other chronic pain: Secondary | ICD-10-CM

## 2011-03-19 DIAGNOSIS — R030 Elevated blood-pressure reading, without diagnosis of hypertension: Secondary | ICD-10-CM

## 2011-03-19 DIAGNOSIS — M549 Dorsalgia, unspecified: Secondary | ICD-10-CM

## 2011-03-19 MED ORDER — HYDROCODONE-ACETAMINOPHEN 10-500 MG PO TABS: 1.0000 | ORAL_TABLET | Freq: Four times a day (QID) | ORAL | Status: DC | PRN

## 2011-03-19 NOTE — Telephone Encounter (Signed)
   I discussed with Mr. Brent Reed, the importance of an appt for his back and anxiety. He has made an appt and is bringing in overdue funds. I have seen his children on a regular basis. I will give 2 weeks of medication His appt is on 04/14/11

## 2011-03-19 NOTE — Progress Notes (Signed)
I spoke to [name]- he has scheduled an appt with me

## 2011-03-19 NOTE — Progress Notes (Signed)
Patient in today for BP check. States he feels very anxious now .  Daughter has appointment with Dr. Jeanice Lim now and he states he will talk to Dr. Lillia Abed . Will forward BP reading to Dr. Jeanice Lim

## 2011-04-02 ENCOUNTER — Inpatient Hospital Stay (INDEPENDENT_AMBULATORY_CARE_PROVIDER_SITE_OTHER)
Admission: RE | Admit: 2011-04-02 | Discharge: 2011-04-02 | Disposition: A | Discharge status: Home / Self Care | Payer: Self-pay | Source: Ambulatory Visit | Attending: Family Medicine | Admitting: Family Medicine

## 2011-04-02 DIAGNOSIS — S8000XA Contusion of unspecified knee, initial encounter: Secondary | ICD-10-CM

## 2011-04-14 ENCOUNTER — Encounter: Payer: Self-pay | Admitting: Family Medicine

## 2011-04-14 ENCOUNTER — Ambulatory Visit (INDEPENDENT_AMBULATORY_CARE_PROVIDER_SITE_OTHER): Payer: Self-pay | Admitting: Family Medicine

## 2011-04-14 VITALS — BP 152/90 | HR 73 | Temp 98.4°F | Ht 70.0 in | Wt 161.0 lb

## 2011-04-14 DIAGNOSIS — G894 Chronic pain syndrome: Secondary | ICD-10-CM

## 2011-04-14 DIAGNOSIS — G8929 Other chronic pain: Secondary | ICD-10-CM

## 2011-04-14 DIAGNOSIS — M549 Dorsalgia, unspecified: Secondary | ICD-10-CM

## 2011-04-14 DIAGNOSIS — I10 Essential (primary) hypertension: Secondary | ICD-10-CM

## 2011-04-14 DIAGNOSIS — M25561 Pain in right knee: Secondary | ICD-10-CM

## 2011-04-14 DIAGNOSIS — M25569 Pain in unspecified knee: Secondary | ICD-10-CM

## 2011-04-14 LAB — COMPREHENSIVE METABOLIC PANEL
Albumin: 4.9 g/dL (ref 3.5–5.2)
BUN: 16 mg/dL (ref 6–23)
CO2: 26 mEq/L (ref 19–32)
Calcium: 9.8 mg/dL (ref 8.4–10.5)
Chloride: 102 mEq/L (ref 96–112)
Glucose, Bld: 97 mg/dL (ref 70–99)
Potassium: 4 mEq/L (ref 3.5–5.3)

## 2011-04-14 MED ORDER — HYDROCODONE-ACETAMINOPHEN 10-500 MG PO TABS: 1.0000 | ORAL_TABLET | Freq: Four times a day (QID) | ORAL | Status: DC | PRN

## 2011-04-14 MED ORDER — HYDROCHLOROTHIAZIDE 12.5 MG PO CAPS: 12.5000 mg | ORAL_CAPSULE | Freq: Every day | ORAL | Status: DC

## 2011-04-14 MED ORDER — CYCLOBENZAPRINE HCL 10 MG PO TABS: 10.0000 mg | ORAL_TABLET | Freq: Three times a day (TID) | ORAL | Status: DC | PRN

## 2011-04-14 NOTE — Assessment & Plan Note (Signed)
Multiple reading with elevated BP Start low dose HCTZ Recheck in 1 month

## 2011-04-14 NOTE — Progress Notes (Signed)
  Subjective:    Patient ID: Brent Reed, male    DOB: 1976/04/20, 34 y.o.   MRN: [id number]  HPI Blood pressure- BP has been elevated at home top number 150-160s at drug store, no CP, no HA Back Pain-- out of pain medication and flexeril, chronic pain, no new changes - needs UDS for chronic med managment Right knee pain- playing basketball approx 1 week, ago, went up twisted trying to dunk , currently no swelling, +catching, no locking using an ACE Wrap , no pain meds needed for knee, seen at [facility]        Review of Systems per above     Objective:   Physical Exam   GEN- NAD, alert and oriented    CVS- RRR, no murmur    resp- CTAB   EXT- no edema, pulses 2+   EXT- Knee: Right Normal to inspection with no erythema or effusion or obvious bony abnormalities. Palpation normal with no warmth mild TTP surrounding patella on medial and lateral side  ROM normal in flexion and extension and lower leg rotation. Ligaments with solid consistent endpoints including ACL, PCL, LCL, MCL. Negative  provocative meniscal tests. Non painful patellar compression. Patellar tracks medially +crepitus with movement of patella- pt states he has always been able to move his knee cap, even before the injury and it makes noises at time Hamstring and quadriceps strength is normal.   Gait normal    Assessment & Plan:

## 2011-04-14 NOTE — Patient Instructions (Signed)
Return for a recheck on your blood pressure in 1 month Take the HCTZ daily- this can cause headache and dizziness with the initial start, if you have any difficulties with the medication please let me know

## 2011-04-15 ENCOUNTER — Encounter: Payer: Self-pay | Admitting: Family Medicine

## 2011-04-15 DIAGNOSIS — M25561 Pain in right knee: Secondary | ICD-10-CM | POA: Insufficient documentation

## 2011-04-15 LAB — DRUGS OF ABUSE SCREEN W/O ALC, ROUTINE URINE
Barbiturate Quant, Ur: NEGATIVE
Benzodiazepines.: NEGATIVE
Cocaine Metabolites: NEGATIVE
Creatinine,U: 199.4 mg/dL
Phencyclidine (PCP): NEGATIVE

## 2011-04-15 NOTE — Assessment & Plan Note (Addendum)
UDS today Refilled pain meds and flexeril Note pt seen at [facility] for a new problem therefore not a violation of pain contract

## 2011-04-15 NOTE — Assessment & Plan Note (Signed)
No red flags one exam  If the catching persist, will obtain imaging, but pt appears to have chronic problems with patellar movement, consider sending to [facility] Pt now back to regular activities therefore will hold off

## 2011-04-20 ENCOUNTER — Telehealth: Payer: Self-pay | Admitting: Family Medicine

## 2011-04-20 NOTE — Telephone Encounter (Signed)
No answer left a message for pt to return call on both numbers. Told him I will be back wed

## 2011-04-22 ENCOUNTER — Telehealth: Payer: Self-pay | Admitting: Family Medicine

## 2011-04-22 NOTE — Telephone Encounter (Signed)
Pt returned call and says he can be reached after 2:30 today

## 2011-04-23 NOTE — Telephone Encounter (Signed)
Left a message on voice mail, will try again in AM

## 2011-04-24 LAB — OPIATE, QUANTITATIVE, URINE
Codeine Urine: NEGATIVE NG/ML
Hydrocodone: 1071 NG/ML — ABNORMAL HIGH
Hydromorphone - Total: 227 NG/ML — ABNORMAL HIGH

## 2011-04-24 NOTE — Telephone Encounter (Signed)
Please call pt back at around 2:45 to 3:00 in the afternoons because he will be back from picking up children from school

## 2011-04-24 NOTE — Telephone Encounter (Signed)
No emergency with labs Wanted to discuss the marijuana found in his UDS Left a message if I dont here from him on Monday, then we an discuss at the next visit with his children or himself

## 2011-05-08 ENCOUNTER — Telehealth: Payer: Self-pay | Admitting: Family Medicine

## 2011-05-08 DIAGNOSIS — M549 Dorsalgia, unspecified: Secondary | ICD-10-CM

## 2011-05-08 DIAGNOSIS — G8929 Other chronic pain: Secondary | ICD-10-CM

## 2011-05-08 NOTE — Telephone Encounter (Signed)
Pt asking to speak with MD re: his lab results, pt says if he doesn't answer his phone the 1st time to hang up and call right back.

## 2011-05-08 NOTE — Telephone Encounter (Signed)
Forward to [place] 

## 2011-05-11 MED ORDER — CYCLOBENZAPRINE HCL 10 MG PO TABS: 10.0000 mg | ORAL_TABLET | Freq: Three times a day (TID) | ORAL | Status: DC | PRN

## 2011-05-11 MED ORDER — HYDROCODONE-ACETAMINOPHEN 10-500 MG PO TABS: 1.0000 | ORAL_TABLET | Freq: Four times a day (QID) | ORAL | Status: DC | PRN

## 2011-05-11 NOTE — Telephone Encounter (Signed)
Discussed with pt his drug screen was positive for marijuana. States it is in his file that he uses marijuana, it helps with his stress,abd pain and appetite. I explained that another physician may not continue to fill his pain medications based on the pain contract States he is aware and will keep that in mind.  He has history of this, was seeing a pain management physician, very difficult social situation as well,  I will fill his meds today Script at front desk

## 2011-05-11 NOTE — Telephone Encounter (Signed)
Forward to [facility] for review, patient requesting refills.Busick, Rodena Medin

## 2011-05-11 NOTE — Telephone Encounter (Signed)
Pt is also calling for his refill on vicodin/flexeril pls call [phone]

## 2011-05-15 NOTE — Op Note (Signed)
NAMESALVATORE, [name]                          ACCOUNT NO.:  [id number]   MEDICAL RECORD NO.:  [id number]                   PATIENT TYPE:  OIB   LOCATION:  3013                                 FACILITY:  [facility]   PHYSICIAN:  Coletta Memos, M.D.                  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   PROCEDURE:  Right L4-5 semi hemilaminectomy with diskectomy with microscopic  resection.   COMPLICATIONS:  None.   SURGEON:  Coletta Memos, M.D.   ASSISTANT:  Danae Orleans. Venetia Maxon, M.D.   ANESTHESIA:  General endotracheal anesthesia.   INDICATIONS FOR PROCEDURE:  Brent Reed is a 35 year old with a history of  right lower extremity pain. He has a small  but well placed cyst at the L4-5  disk space. I recommended and  he agreed to undergo operative resection as  conservative measures had failed.   DESCRIPTION OF PROCEDURE:  Brent Reed was brought to the operating room,  intubated and placed under general anesthesia without difficulty. Using  preoperative localizing film after the patient was prepped and draped, I  infiltrated with 0.5% Lidocaine with 1:200,000 strength  epinephrine into  the lumbar region of my incision.   I then opened the skin with a #10 blade and took this down to the  thoracolumbar fascia. I then exposed the lamina of L4 and L5, taking x-rays  and placing a double-ended ganglia knife inferior  to the lamina of L4.  Using that as the guide, I then performed a semi hemilaminectomy of L4.   I brought the microscope into the operative field for microscopic resection.  I was able to retract the  thecal sac medially after removing the ligament  of Flavum and identifying the disk. I then opened the disk space with a #15  blade, and with Dr. Fredrich Birks  assistance, removed the disk  in a progressive  fashion.   After adequately decompressing the L4 and L5 nerve roots on the right side,  I irrigated the wound. I inspected the neuroforamen and again felt that  there was no disk material remaining. I then irrigated once more. I then  closed the  wound in a layered fashion using Vicryl sutures, reapproximating the  subcutaneous tissue with Vicryl sutures. Dermabond was used for  a sterile  dressing.                                                 Coletta Memos, M.D.    KC/MEDQ  D:  07/06/2003  T:  07/07/2003  Job:  [id number]
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
## 2011-05-15 NOTE — Discharge Summary (Signed)
NAMEBILAL, MANZER                ACCOUNT NO.:  [id number]   MEDICAL RECORD NO.:  [id number]          PATIENT TYPE:  OBV   LOCATION:  5149                         FACILITY:  [facility]   PHYSICIAN:  Sharin Grave, MD  DATE OF BIRTH:  [birth year]   DATE OF ADMISSION:  02/10/07  DATE OF DISCHARGE:  02/05/2007                               DISCHARGE SUMMARY   ADMISSION DIAGNOSES:  1. General tonoclonic seizure episode.  2. Sinus tachycardia.  3. Hypokalemia.   DISCHARGE DIAGNOSES:  1. Seizure disorder unknown origin.  2. Sinus tachycardia resolved.  3. Hypokalemia likely transitory resolved.  4. Marijuana abuse.   OTHER DIAGNOSES:  1. History of bipolar disorder.  2. Tobacco dependence.  3. History of chronic back pain.   DISCHARGE MEDICATIONS:  1. Depakote 500 mg 1 tablet p.o. b.i.d.  2. Ultracet 325/37.5 mg 1-2 pills every 6 hours p.r.n. pain.   DISCHARGE INSTRUCTIONS:  The patient needs call [facility] and schedule an appointment in 2-3 weeks and he was provided  Dr. Nash Shearer, neurologist, phone number to schedule an appointment in 1  week.   ISSUES TO FOLLOWUP:  1. The patient would need to have a Depakote level checked at [facility] in 2-3 weeks.  2. He needs a workup as an outpatient at Dr. Fredda Hammed office for      seizure disorder.  3. He is going to need an outpatient electroencephalogram and MRI.  4. The patient was instructed not to drive for at least 3 months.   STUDIES:  CT scan of the head on 02-10-07 showed no acute  intracranial abnormalities.  Mild prominence of the cerebellar tonsils  at the foramen of Monro.  Chiari type 1 malformation not excluded.   BRIEF HOSPITAL COURSE:  1. The patient is a 35 year old unemployed male with history of      chronic back pain and on chronic narcotic for this and history of      bipolar disorder on amitriptyline.  He came on 2007-02-10      after an episode of  generalized tonoclonic seizure that lasted from      30 seconds to 1 minute.  Please see dictated History of Present      Illness for admission details.  Apparently, the patient had a      similar episode about 2 years ago without any workup.  He was      evaluated by Dr. Nash Shearer, neurologist and he was loaded with      Depakote 1 gram IV initially.  He remained free of seizures for 24      hours remained in the hospital.  Neurology recommended followup as      an outpatient.  He would an electroencephalogram and a MRI.  He was      also restricted from driving for 3 months.  It was believed that      the combination of Ultram plus amitriptyline may have decreased the      patient's seizure threshold.  Amitriptyline was  discontinued.  Our      team thought that maybe Lamictal would be a good agent for this      patient given his history of bipolar disorder, but neurology      recommended to continue followup Depakote 500 mg b.i.d.  He will      need to have a level checked in 2-3 weeks at [facility].  2. Hypokalemia.  The patient was admitted with a potassium level of      2.5.  He does not know that he has had problems with his potassium      in the past. His hypokalemia easily resolved after replacement with      a total of 80 mEq of potassium during his hospital stay.  We think      that likely this is a transitory event related to poor diet intake,      plus emesis.  There are no signs of interstitial nephritis based on      urinalysis results.  His proteinuria is not in nephrotic range and      the 30 g that he presented with was probably secondary dehydration.      He did not present acidosis on arrival and his urine electrolytes      did not show increased urinary excretion of electrolytes.  So,      unlikely renal tubular acidosis type 1 or 2.  He does present      hypertension.  On discharge day his blood pressure was 136/77.      Hyperaldosteronism is a  remote possibility.  Consider checking      renin and aldosterone levels as an outpatient if hypokalemia      becomes recurrent.  I would recommend recheck of BMET once he is      back in the office. He did not present any T-wave changes      associated with his hypokalemia on EKG.  So, likely, this was just      a transient episode.  3. Bipolar disorder.  History of bipolar disorder remained stable      during hospitalization.  4. History of chronic back pain.  As per team, the patient was      discharged Ultram to take 1 or 2 p.o. every six hours p.r.n..  5. Tobacco dependence.  The patient declined smoking cessation      consult.  He was also encouraged to quit illicit drug use.   DISPOSITION:  He was discharged home in improved stable condition to  followup with [facility]. [facility] in 2 weeks and  with Dr. Nash Shearer in about 1 week.      Sharin Grave, MD     AM/MEDQ  D:  02/05/2007  T:  02/06/2007  Job:  [id number]   cc:   Henri Medal, MD

## 2011-05-15 NOTE — Op Note (Signed)
NAMEJAMORRIS, Brent Reed                          ACCOUNT NO.:  [id number]   MEDICAL RECORD NO.:  [id number]                   PATIENT TYPE:  OIB   LOCATION:  3013                                 FACILITY:  [facility]   PHYSICIAN:  Coletta Memos, M.D.                  DATE OF BIRTH:  October 31, 1976   DATE OF PROCEDURE:  05/26/2003  DATE OF DISCHARGE:  05/25/2003                                 OPERATIVE REPORT   PREOPERATIVE DIAGNOSIS:  Herniated disk, right L4-L5 lumbar radiculopathy.   POSTOPERATIVE DIAGNOSIS:  Herniated disk, right L4-L5 lumbar radiculopathy.   PROCEDURE:  Right L4-L5 semihemilaminectomy and diskectomy.   COMPLICATIONS:  None.   SURGEON:  Coletta Memos, M.D.   ASSISTANT:  Danae Orleans. Venetia Maxon, M.D.   ANESTHESIA:  General endotracheal.   INDICATIONS:  This patient is a 35 year old gentleman who presented with  pain in his right lower extremity secondary to a herniated disk at L4-L5 for  approximately three years.  Without improvement, he decided to undergo  operative resection of the mass.   OPERATIVE NOTE:  The patient was brought to the operating room and sedated  and placed under general anesthesia without difficulty.  He was placed prone  onto a Wilson frame, and all pressure points were properly padded.  His back  was prepped, and he was draped in a sterile fashion.  I placed a spinal  needle for localization, and that was found to be at L4-L5.  He was then had  infiltrated 10 cc of 0.5% lidocaine with 1:200,000 epinephrine into the  subcutaneous tissue and the paraspinous muscles on the right side.  I opened  the skin with a #10 blade and too this down to the thoracolumbar fascia  sharply.  I controlled bleeding in the subcutaneous tissues with monopolar  cautery.  I then exposed the laminae of L4 and L5 in a subperiosteal fashion  using the monopolar cautery.  I placed the double-ended ganglion knife  inferior to the inferior edge of L4 and took another x-ray.  This  actually  showed the double-ended ganglion knife was in the correct position.  I then  proceeded with the semihemilaminectomy using a high-speed air drill at L4.  I removed the ligamentum flavum and exposed the thecal sac.  The microscope  was brought into the operative field.  With Dr. Fredrich Birks assistance, we  retracted the thecal sac medially.  I then opened the disk space at L4-L5  with a #15 blade.  I used bipolar cautery to burn some epidural veins.  I  then proceeded with Dr. Venetia Maxon to perform a diskectomy using pituitary  rongeurs and Epstein curettes.  When I was satisfied with the decompression,  I then inspected the neural foramen at L4-L5 and the neural foramen of the  L5 root and the L4 root.  I felt that there was no compression there.  I  irrigated  the wound, and then closed the wound in layered fashion using  Vicryl sutures.  This was done in a layered fashion in the thoracolumbar  fascia and subcutaneus tissue.  Dermabond was used for a sterile dressing.                                               Coletta Memos, M.D.   KC/MEDQ  D:  05/25/2003  T:  05/26/2003  Job:  [id number]

## 2011-05-15 NOTE — H&P (Signed)
NAMEZAKARIYE, NEE                ACCOUNT NO.:  [id number]   MEDICAL RECORD NO.:  [id number]          PATIENT TYPE:  OBV   LOCATION:  1827                         FACILITY:  [facility]   PHYSICIAN:  Zenaida Deed. Mayford Knife, M.D.DATE OF BIRTH:  May 08, 1976   DATE OF ADMISSION:  02/05/2007  DATE OF DISCHARGE:                              HISTORY & PHYSICAL   CHIEF COMPLAINT:  Seizure.   HISTORY OF PRESENT ILLNESS:  The patient is a complicated young man who  presented to the ED following a seizure with subsequent nausea and  vomiting on arrival to the ED.  The event was witnessed by the patient's  wife at home.  He was playing a video game when his eyes rolled to the  back of his head he started shaking.  He remembers nothing.  The  duration was 30 seconds to 1 minute.  He recovered without neuromotor  deficit above baseline.  He did not micturate or defecate.  On arrival  to the ED, he vomited nonbloody, nonbilious emesis.  He notes a similar  seizure episode 3 years ago while taking Wellbutrin.  He has no other  seizure history.  He denies drug or alcohol use, but has a chronic pain  history with medication overuse and multiple pain contracts with the  [facility].   PAST MEDICAL HISTORY:  1. Back pain, low.  2. Manic-depressive disorder.  3. Tobacco abuse.  4. Drug dependence.   FAMILY HISTORY:  Father and twin brother with aggressive personality  disorder.   SOCIAL HISTORY:  The patient is married, lives with his wife and 3  children at home.  He is unemployed secondary to back pain.  He abuses  marijuana and smokes 1/2 pack per day of cigarettes.  He denies alcohol.   ALLERGIES:  WELLBUTRIN; he experienced a seizure while taking the  medicine.   MEDICATIONS:  1. Amitriptyline 25 mg p.o. q.a.m., 50 mg p.o. nightly.  2. Tramadol 100 mg p.o. q.6 h. p.r.n. pain.   REVIEW OF SYSTEMS:  CONSTITUTIONAL:  Denies fever, fatigue, etc.  CARDIOVASCULAR:  Denies chest pain, palpitations and edema.   PULMONARY:  Denies shortness of breath and cough.  GI:  Nausea and vomiting.  Denies  pain and changes in bowel or bladder.  GU:  Normal.  SKIN:  No rash, but  abrasion on the right cheek from a fall during the prior day.  Remainder  of ROS is otherwise unremarkable.   PHYSICAL EXAM:  VITAL SIGNS:  Temperature 96.9 degrees Fahrenheit, heart  rate 83, blood pressure 128/81, respirations 20, SPO2 99% on room air.  GENERAL:  Awake, alert, in no acute distress.  HEENT:  Pupils equally round and reactive to light.  No nystagmus or  icterus.  The patient has erythematous ecchymosis on right cheek  secondary to a fall.  NECK:  Supple.  No JVD.  CARDIAC:  Regular rate and rhythm.  No murmurs, rubs or gallops.  PMI  nondisplaced.  Capillary refill less than 2 seconds.  No peripheral  edema.  PULMONARY:  Lungs clear to auscultation bilaterally.  ABDOMEN:  Soft, nontender and nondistended.  Positive bowel sounds.  No  palpable masses or organomegaly.  NEUROLOGIC:  Cranial nerves II-XII grossly intact.  No motor focality.  Strength in upper and lower extremities 5/5.  Sensation 5/5.  Reflexes  2+ bilaterally.  Normal gait, heel-shin, finger-nose.   LABORATORY DATA AND TESTS:  Potassium 2.6, glucose 127, BMET otherwise  normal.  D-dimer 0.26 and normal.  Troponin I 0.01 and normal.  Hemoglobin 15.6.  UDS positive for marijuana.  UA negative.  EtOH  undetectable.  INR 1.0.   EKG with sinus tachycardia, right axis deviation and nonspecific T-wave  changes.   CT of the head with no acute process, normal.   ASSESSMENT AND PLAN:  The patient is a 35 year old gentleman with  seizure, hypokalemia, EKG changes.   1. Seizure:  The patient has history of substance abuse as a risk      factor.  He has had 1 prior seizure.  Amitriptyline can be      associated with this, but he has been on this medicine for several      months.  No evidence of acute infectious process.  Normal imaging.      The  patient had a prompt recovery without postictal sequelae.  Risk      of recurrence after 2 seizures can be up to 65%.  EEG data might be      a helpful guide as to the antiepileptic therapy or anti-seizure      prophylaxis decisions.  Will observe and have him follow up with      Neurology as an outpatient.  Will curbside Neurology during the day      regarding additional workup.  2. Hypokalemia:  Likely secondary to emesis.  However, potassium of      2.6 cannot be explained by emesis alone.  In discussion with the      family, it is possible the patient has a very poor diet and might      be suffering from inadequate intake; this is still unlikely.  No      evidence of alkalosis.  The patient has not been on diuretics or      beta adrenergics.  Glucose is slightly elevated and it is unlikely      the patient has taken insulin.  We will replete potassium and      recheck level this afternoon.  The patient will need outpatient      followup and monitoring to make sure that his potassium stays      stable.  He may be a candidate for ongoing potassium      supplementation.  3. EKG changes:  The patient has hypokalemia.  This disorder's      repolarization with common ST depression, T-U wave disorders and      nonspecific changes in the T waves.  This appears to be exactly      what we were seeing on his EKG.  The patient is a low risk for      primary acute coronary syndrome.  Will follow.  In review of the      patient's clinic note, he has overdue for a fasting lipid panel.      We will get this if he is a year in the morning and fasting.  We      will recheck EKG at 3 p.m. this afternoon to compare, once his      potassium has been repleted.  4.  Back pain:  The patient is disabled secondary to this complaint.      We have seen him in clinic with multiple attempts to get pain     refills above his legally prescribed levels.  Continue home      regimen.  Please do not change from this  during his outpatient      stay.  5. Manic depressive disorder:  The patient is stable for this at this      time.  6. Tobacco abuse:  Smoking cessation consult has been offered.  The      patient declined.  We will order one for him anyway in hopes that      they may changes my mind and will give him a nicotine patch while      he is an      inpatient.  7. Fluids, electrolytes and nutrition/gastrointestinal:  Normal diet.  8. Disposition:  Home, pending resolution of hypokalemia and stable      clinical course.      Towana Badger, M.D.    ______________________________  Zenaida Deed. Mayford Knife, M.D.    JP/MEDQ  D:  02/04/2007  T:  02/04/2007  Job:  [id number]

## 2011-05-15 NOTE — Consult Note (Signed)
Brent Reed, Brent Reed                ACCOUNT NO.:  [id number]   MEDICAL RECORD NO.:  [id number]          PATIENT TYPE:  OBV   LOCATION:  5149                         FACILITY:  [facility]   PHYSICIAN:  Bevelyn Buckles. Champey, M.D.DATE OF BIRTH:  28-Mar-1976   DATE OF CONSULTATION:  02/04/2007  DATE OF DISCHARGE:  02/05/2007                                 CONSULTATION   REQUESTING PHYSICIAN:  Towana Badger, M.D.   REASON FOR CONSULTATION:  Seizure.   HISTORY OF PRESENT ILLNESS:  Brent Reed is a 35 year old Caucasian male  with multiple medical problems who presented to the ED after early this  morning having a seizure.  Patient had a single seizure episode two  years ago which was thought secondary to Wellbutrin.  This morning he  was playing video games and had another seizure episode.  He had no  warning or aura to this.  He developed loss of consciousness, multiple  body convulsions, foaming of the mouth and choking.  Family also states  that occasionally he would have a gazed look in his eyes.  He did not  have any incontinence or tongue biting.  The episode lasted a few  minutes in duration.  Afterwards he was slow and very confused.  He has  no history of head trauma or meningitis or encephalitis.  He denies any  other symptoms, such as focal weakness, numbness, vision changes, gait  changes, swallowing problems, chewing problems, dizziness, vertigo,  balance problems or falls.   PAST MEDICAL HISTORY:  Positive for chronic pain, questionable  depression/bipolar, Crohn's disease.   CURRENT MEDICATIONS:  Tramadol, Vicodin p.r.n.   ALLERGIES:  NONE.   FAMILY HISTORY:  Positive for diabetes and heart disease.  No family  history of seizures.   SOCIAL HISTORY:  The patient lives with his wife and children.  Smokes a  half a pack to one pack of cigarettes per day.  Denies alcohol or drug  use.   REVIEW OF SYSTEMS:  Review of systems is positive as per HPI.  Review of  systems is negative  as per HPI in eight other organ systems.   EXAMINATION:  VITALS:  Temperature is 96.9, pulse is 83, blood pressure  is 128/81, respirations 20.  HEENT:  Normocephalic.  Patient has a right cheek abrasion.  Extraocular  muscles are intact.  Face is symmetric.  NECK:  Neck is supple.  No carotid bruits.  HEART:  Regular.  LUNGS:  Clear.  ABDOMEN:  Soft and nontender.  EXTREMITIES:  Show no edema with good pulses.  NEUROLOGICAL:  Patient is awake, alert, following commands  appropriately.  Language is fluent.  Cranial nerves II-XII are grossly  intact.  Motor examination shows 4+ to 5/5 strength throughout.  Patient  has some subtle right lower extremity weakness at 4 to 4+/5 strength.  Sensory examination:  The patient has decreased sensation on the right  lower extremity which is old.  The reflexes are 1 to 2+ throughout.  Cerebellar function is within normal limits.  Finger-to-nose here was  not assessed secondary to safety.   LABORATORY DATA:  CT of head showed no acute findings.  Labs:  Hemoglobin 15.6, hematocrit 46, coags are normal.  Basic metabolic panel  is normal.   ASSESSMENT AND PLAN:  [age]-year-old Asian male with second  seizure episode this a.m., also with a history of chronic pain and  questionable bipolar and depression.  Patient was loaded with Depacon.  As I have discussed with the patient, I would recommend starting  Depakote maintenance 500 mg twice a day.  Patient needs an EEG and an  MRI of the brain, both of which can be done as an outpatient when he is  stable.  Need to have a Depakote level checked in 2-3 weeks and also  check LFTs to get a baseline.  Patient should not drive for at least  three months secondary to his seizure.  We will follow the patient up as  an outpatient.  The case was discussed with Dr. Mannie Stabile.      Bevelyn Buckles. Nash Shearer, M.D.  Electronically Signed     DRC/MEDQ  D:  02/04/2007  T:  02/05/2007  Job:  [id number]

## 2011-06-05 ENCOUNTER — Other Ambulatory Visit: Payer: Self-pay | Admitting: Family Medicine

## 2011-06-05 DIAGNOSIS — M549 Dorsalgia, unspecified: Secondary | ICD-10-CM

## 2011-06-05 DIAGNOSIS — G8929 Other chronic pain: Secondary | ICD-10-CM

## 2011-06-05 MED ORDER — CYCLOBENZAPRINE HCL 10 MG PO TABS: 10.0000 mg | ORAL_TABLET | Freq: Three times a day (TID) | ORAL | Status: DC | PRN

## 2011-06-05 MED ORDER — HYDROCODONE-ACETAMINOPHEN 10-500 MG PO TABS: 1.0000 | ORAL_TABLET | Freq: Four times a day (QID) | ORAL | Status: DC | PRN

## 2011-06-05 NOTE — Telephone Encounter (Signed)
Done- no fill until 6/14 as he is too early

## 2011-06-05 NOTE — Telephone Encounter (Signed)
Fwd to PCP

## 2011-06-05 NOTE — Telephone Encounter (Signed)
Needs refill hydrocodone and flexeril

## 2011-06-23 ENCOUNTER — Encounter: Payer: Self-pay | Admitting: Family Medicine

## 2011-06-30 ENCOUNTER — Telehealth: Payer: Self-pay | Admitting: Family Medicine

## 2011-06-30 DIAGNOSIS — G8929 Other chronic pain: Secondary | ICD-10-CM

## 2011-06-30 NOTE — Telephone Encounter (Signed)
Needs refill on his hydrocodone & flexeril pls call when ready

## 2011-07-03 ENCOUNTER — Other Ambulatory Visit: Payer: Self-pay | Admitting: Family Medicine

## 2011-07-03 DIAGNOSIS — M549 Dorsalgia, unspecified: Secondary | ICD-10-CM

## 2011-07-03 MED ORDER — HYDROCODONE-ACETAMINOPHEN 10-500 MG PO TABS: 1.0000 | ORAL_TABLET | Freq: Four times a day (QID) | ORAL | Status: DC | PRN

## 2011-07-03 NOTE — Telephone Encounter (Signed)
Reviewed chart Pt had Rx for hydrocodone written on 6/8 with comment not to fill until 6/14.  Pt relates he had filled on 6/8.  Called pharmacy and yes the comment was on the Rx but yes they filled it on 6/8.  I wrote Rx for #40 to last the 13 days until his appt with Dr Ashley Royalty.  Suggested that the goal should be to get off narcotics eventually.

## 2011-07-03 NOTE — Telephone Encounter (Signed)
Still waiting on Rx for Hydrocodone and Flexeril and doesn't have any for the weekend.

## 2011-07-16 ENCOUNTER — Encounter: Payer: Self-pay | Admitting: Family Medicine

## 2011-07-16 ENCOUNTER — Ambulatory Visit (INDEPENDENT_AMBULATORY_CARE_PROVIDER_SITE_OTHER): Payer: Self-pay | Admitting: Family Medicine

## 2011-07-16 DIAGNOSIS — G8929 Other chronic pain: Secondary | ICD-10-CM

## 2011-07-16 DIAGNOSIS — M549 Dorsalgia, unspecified: Secondary | ICD-10-CM

## 2011-07-16 DIAGNOSIS — I10 Essential (primary) hypertension: Secondary | ICD-10-CM

## 2011-07-16 DIAGNOSIS — G894 Chronic pain syndrome: Secondary | ICD-10-CM

## 2011-07-16 DIAGNOSIS — J309 Allergic rhinitis, unspecified: Secondary | ICD-10-CM

## 2011-07-16 MED ORDER — HYDROCHLOROTHIAZIDE 25 MG PO TABS: 25.0000 mg | ORAL_TABLET | Freq: Every day | ORAL | Status: DC

## 2011-07-16 MED ORDER — FLUTICASONE PROPIONATE 50 MCG/ACT NA SUSP: 2.0000 | Freq: Every day | NASAL | Status: DC

## 2011-07-16 MED ORDER — CYCLOBENZAPRINE HCL 10 MG PO TABS: 10.0000 mg | ORAL_TABLET | Freq: Three times a day (TID) | ORAL | Status: DC | PRN

## 2011-07-16 MED ORDER — HYDROCODONE-ACETAMINOPHEN 10-500 MG PO TABS: 1.0000 | ORAL_TABLET | Freq: Four times a day (QID) | ORAL | Status: DC | PRN

## 2011-07-19 ENCOUNTER — Encounter: Payer: Self-pay | Admitting: Family Medicine

## 2011-07-19 DIAGNOSIS — J309 Allergic rhinitis, unspecified: Secondary | ICD-10-CM | POA: Insufficient documentation

## 2011-07-19 NOTE — Progress Notes (Signed)
  Subjective:    Patient ID: Brent Reed, male    DOB: 07-Mar-1976, 35 y.o.   MRN: [id number]  HPI 1.Meet new PCP/Chronic back pain:  Patient here to meet new pcp and have his pain medicine refilled.  Has had chronic back pain since 2004 2/2 to bulging discs and sciatic nerve pain.  Had tried multiple other treatments for pain including tramadol but this caused seizure, PT, spinal injections.  Has also tried neurontin in the past and this did not help.  Denies bowel or bladder dysfunction associated with back pain. 2.?Sinus infection:  Has had congestion and mild headaches x 1 week.  Also with cough.  Denies fever, chills, tooth pain, pressure when leaning forward or purulent drainage. 3.HTN:  Blood pressure elevated today.  Did take medication today.  Pain is well controlled currently.  Denies chest pain, weakness, shortness of breath, palpitations.   Review of Systems See HPI    Objective:   Physical Exam  Constitutional: He appears well-developed and well-nourished. No distress.  HENT:  Head: Normocephalic and atraumatic.  Nose: Mucosal edema present. No rhinorrhea. Right sinus exhibits no maxillary sinus tenderness and no frontal sinus tenderness. Left sinus exhibits no maxillary sinus tenderness and no frontal sinus tenderness.  Mouth/Throat: Oropharynx is clear and moist and mucous membranes are normal.  Cardiovascular: Normal rate, regular rhythm and normal heart sounds.   Pulmonary/Chest: Effort normal and breath sounds normal.          Assessment & Plan:

## 2011-07-19 NOTE — Assessment & Plan Note (Signed)
Stable, hydrocodone refilled.  Currently on pain contract, noted in [place] in epic

## 2011-07-19 NOTE — Assessment & Plan Note (Signed)
BP still elevated on 12.5mg  HCTZ, will increase HCTZ to 25mg  and have him back in two weeks to recheck

## 2011-07-19 NOTE — Assessment & Plan Note (Signed)
Current symptoms sound more like AR rather than sinus infection.  Will give trial of flonase to see if this helps improve symptoms.

## 2011-08-26 ENCOUNTER — Inpatient Hospital Stay (INDEPENDENT_AMBULATORY_CARE_PROVIDER_SITE_OTHER)
Admission: RE | Admit: 2011-08-26 | Discharge: 2011-08-26 | Disposition: A | Discharge status: Home / Self Care | Payer: Self-pay | Source: Ambulatory Visit | Attending: Emergency Medicine | Admitting: Emergency Medicine

## 2011-08-26 DIAGNOSIS — R51 Headache: Secondary | ICD-10-CM

## 2011-08-26 DIAGNOSIS — J019 Acute sinusitis, unspecified: Secondary | ICD-10-CM

## 2011-09-28 LAB — POCT I-STAT, CHEM 8
Calcium, Ion: 1.13
Creatinine, Ser: 1.2
Hemoglobin: 13.6
Sodium: 140
TCO2: 27

## 2011-09-28 LAB — CARBAMAZEPINE LEVEL, TOTAL
Carbamazepine Lvl: 10
Carbamazepine Lvl: 9.4

## 2011-10-13 ENCOUNTER — Other Ambulatory Visit: Payer: Self-pay | Admitting: Family Medicine

## 2011-10-13 DIAGNOSIS — M549 Dorsalgia, unspecified: Secondary | ICD-10-CM

## 2011-10-13 MED ORDER — HYDROCODONE-ACETAMINOPHEN 10-500 MG PO TABS: 1.0000 | ORAL_TABLET | Freq: Four times a day (QID) | ORAL | Status: DC | PRN

## 2011-10-13 MED ORDER — CYCLOBENZAPRINE HCL 10 MG PO TABS: 10.0000 mg | ORAL_TABLET | Freq: Three times a day (TID) | ORAL | Status: DC | PRN

## 2011-10-13 NOTE — Progress Notes (Signed)
Received fax reaquest for refill on cyclobenzaprine and hydrocodone. Prescriptions faxed back with cyclobenzaprine 10 mg #60 with one refill and hydrocodone/acetaminophen 10/500 mg #120 with one refill.

## 2011-12-11 ENCOUNTER — Telehealth: Payer: Self-pay | Admitting: *Deleted

## 2011-12-11 ENCOUNTER — Other Ambulatory Visit: Payer: Self-pay | Admitting: Family Medicine

## 2011-12-11 NOTE — Telephone Encounter (Signed)
Pt called (3rd time) requesting Rx refilled for Vicodin and Flexeril. He said that he has contacted his pharmacy 4 days ago. Needs these medications today. Last refill 10-13-11. I told the pt that I have sent a message to Dr.Matthews. Pt's pharmacy is [facility]. Paged Dr.Matthews.  Pt wants call back today. Lorenda Hatchet, Renato Battles

## 2011-12-11 NOTE — Telephone Encounter (Signed)
Pt called back again. He needs his pain medication today. States 'I have been on these medications for years. I can not just stop them. I have done my part and called meds in 4 days ago. I don't have money for OV's'. I explained to the pt, that he is on a pain contract and that it is stated in his pain contract to have OV for Vicodin Rx. Pt said, that he got the last Rx with one refill and thought that his Vicodin can be called in. Again, I explained to the pt that I am not able to have another MD write this Rx for him. His last OV was in July. Pt wants to speak to a 'head doctor' about this. Now. Will ask the preceptor for advise. Lorenda Hatchet, Renato Battles Spoke with Dr.Chambliss and he agreed with above explanation. When I explained to the pt again, that I have sent his request to his PCP and that he has to have an OV for refill of Vicodin and that he is on a pain contract. He has also received a letter in 01/2011 explaining

## 2011-12-11 NOTE — Telephone Encounter (Signed)
Fwd. To PCP .Minna Dumire  

## 2011-12-11 NOTE — Telephone Encounter (Signed)
(  to have a drug screen) patient has not been in the office since July and needs OV to discuss this issue with Dr.Matthews. Pt was very angry and his voice got loud and he said 'You people up there are always changing rules and regulations. Now I have to be without my medications for the whole weekend. You all make decisions about me and I will take care of it.' Fwd. To Dr.Matthews for review. Lorenda Hatchet, Renato Battles

## 2011-12-11 NOTE — Telephone Encounter (Signed)
Pt checking status of rx for pain med, says his pharmacy is only open half day on Saturdays, pt says the pharmacy sent the request.

## 2011-12-14 ENCOUNTER — Telehealth: Payer: Self-pay | Admitting: Family Medicine

## 2011-12-14 NOTE — Telephone Encounter (Signed)
Brent Reed would like to speak to [name] about the problem with getting his medication and all of the calls he has made with no return call.  He did make an appt for this Thursday to see Dr. Ashley Royalty.

## 2011-12-14 NOTE — Telephone Encounter (Signed)
Patient called back.  I explained to him that Dr. Ashley Royalty sent the fax refill request back with the message that "patient needs to make an appointment for additional refills".  Patient did not get this message.  Told him that if he gets a chance to call the pharmacy to see if they did receive that message.  I will follow up with patient when her comes in for his appointment on Thursday.

## 2011-12-14 NOTE — Telephone Encounter (Signed)
Returned call.  No answer so left message for him to call him back.

## 2011-12-17 ENCOUNTER — Ambulatory Visit (INDEPENDENT_AMBULATORY_CARE_PROVIDER_SITE_OTHER): Payer: Self-pay | Admitting: Family Medicine

## 2011-12-17 ENCOUNTER — Encounter: Payer: Self-pay | Admitting: Family Medicine

## 2011-12-17 VITALS — BP 161/88 | HR 109 | Temp 99.1°F | Ht 70.0 in | Wt 168.4 lb

## 2011-12-17 DIAGNOSIS — M549 Dorsalgia, unspecified: Secondary | ICD-10-CM

## 2011-12-17 DIAGNOSIS — G894 Chronic pain syndrome: Secondary | ICD-10-CM

## 2011-12-17 DIAGNOSIS — G8929 Other chronic pain: Secondary | ICD-10-CM

## 2011-12-17 MED ORDER — CYCLOBENZAPRINE HCL 10 MG PO TABS: 10.0000 mg | ORAL_TABLET | Freq: Three times a day (TID) | ORAL | Status: DC | PRN

## 2011-12-17 MED ORDER — HYDROCODONE-ACETAMINOPHEN 10-500 MG PO TABS: 1.0000 | ORAL_TABLET | Freq: Four times a day (QID) | ORAL | Status: DC | PRN

## 2011-12-17 NOTE — Telephone Encounter (Signed)
Addressed on 12/17 by Stevan Born, see phone note.

## 2011-12-24 NOTE — Telephone Encounter (Signed)
Fwd. To PCP 
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
## 2011-12-30 NOTE — Assessment & Plan Note (Signed)
Pain well controlled with current meds.  Hydrocodone and cyclobenzprine refilled.  F/u in three months, UDS at that time per pain contract.

## 2012-04-15 ENCOUNTER — Encounter: Payer: Self-pay | Admitting: Family Medicine

## 2012-04-15 ENCOUNTER — Ambulatory Visit (INDEPENDENT_AMBULATORY_CARE_PROVIDER_SITE_OTHER): Payer: Self-pay | Admitting: Family Medicine

## 2012-04-15 VITALS — BP 147/91 | HR 86 | Ht 70.0 in | Wt 173.0 lb

## 2012-04-15 DIAGNOSIS — I10 Essential (primary) hypertension: Secondary | ICD-10-CM

## 2012-04-15 DIAGNOSIS — J309 Allergic rhinitis, unspecified: Secondary | ICD-10-CM

## 2012-04-15 DIAGNOSIS — IMO0002 Reserved for concepts with insufficient information to code with codable children: Secondary | ICD-10-CM

## 2012-04-15 DIAGNOSIS — M549 Dorsalgia, unspecified: Secondary | ICD-10-CM

## 2012-04-15 DIAGNOSIS — G8929 Other chronic pain: Secondary | ICD-10-CM

## 2012-04-15 MED ORDER — HYDROCHLOROTHIAZIDE 25 MG PO TABS: 25.0000 mg | ORAL_TABLET | Freq: Every day | ORAL | Status: DC

## 2012-04-15 MED ORDER — HYDROCODONE-ACETAMINOPHEN 10-500 MG PO TABS: 1.0000 | ORAL_TABLET | Freq: Four times a day (QID) | ORAL | Status: DC | PRN

## 2012-04-15 MED ORDER — CYCLOBENZAPRINE HCL 10 MG PO TABS: 10.0000 mg | ORAL_TABLET | Freq: Three times a day (TID) | ORAL | Status: DC | PRN

## 2012-04-15 NOTE — Patient Instructions (Signed)
Thank you for coming in today, it was good to see you I would like for you to try mucinex-d or allegra-d short term to see if that helps with your sinus congestion You can continue to use the netti pot. I will see you back in 3 months.

## 2012-04-15 NOTE — Progress Notes (Signed)
  Subjective:    Patient ID: Brent Reed, male    DOB: [birth year], 36 y.o.   MRN: [id number]  HPI  1. Chronic pain:  Here for refill on chronic pain medication.  Medications working well per patient.  No side effects from medications.  As noted in previous notes has failed multiple other therapies and medications.   Chronic Pain Follow-Up Assessment Chronic Pain Diagnosis: Lumbar back pain with radiculopathy Pain scale rating at its BEST in the past 24 hours (1 to 10): 3 Pain scale rating at its WORST in the past 24 hours (1 to 10): 4 Adverse Effects:  (None_x_ Nausea__ Vomiting__ Confusion__ Sleepiness__ Fatigue__ Constipation__ Other__) Treatment of Adverse Effects: n/a Since the last clinic visit, how much relief have pain treatment and medication provided? Please circle the one percentage that shows how much relief you have received: 0%__ 10%__ 20%__ 30%__ 40%__ 50%__ 60%__ 70%_x_ 80%__ 90%__ 100%__ Loraine Leriche the one number that describes how, during the past 24 hours, pain has interfered with your: General Activity 0__ 1__ 2__ 3_x_ 4__ 5__ 6__ 7__ 8__ 9__ 10__ Does not interfere      Completely interferes Mood 0__ 1__ 2_x_ 3__ 4__ 5__ 6__ 7__ 8__ 9__ 10__ Does not interfere      Completely interferes Ability to work (in or out of the home) 0__ 1__ 2__ 3__ 4_x_ 5__ 6__ 7__ 8__ 9__ 10__ Does not interfere      Completely interferes Interactions with other people 0__ 1__ 2__ 3__ 4_x_ 5__ 6__ 7__ 8__ 9__ 10__ Does not interfere      Completely interferes Sleep 0__ 1__ 2_x_ 3__ 4__ 5__ 6__ 7__ 8__ 9__ 10__ Does not interfere      Completely interferes  Enjoyment of life 0__ 1__ 2__ 3__ 4_x_ 5__ 6__ 7__ 8__ 9__ 10__ Does not interfere      Completely interferes  2. Congestions:  Has had sinus congestion and pressure for a few days now.  Has been using netti-pot which helps.  Also using flonase, which does not seem to be helping as much.  Reports green-yellow nasal discharge but denies  fever, tooth pain, sinus tenderness.  Has had seasonal allergies in the past.  He is still smoking but hs cut back to 1/2 ppd.  Review of Systems     Objective:   Physical Exam  Constitutional: He appears well-developed and well-nourished. No distress.  HENT:  Nose: Mucosal edema present. No rhinorrhea. Right sinus exhibits no maxillary sinus tenderness and no frontal sinus tenderness. Left sinus exhibits no maxillary sinus tenderness and no frontal sinus tenderness.  Mouth/Throat: Oropharynx is clear and moist.  Cardiovascular: Normal rate, regular rhythm and normal heart sounds.   Pulmonary/Chest: Effort normal and breath sounds normal.          Assessment & Plan:

## 2012-04-18 NOTE — Assessment & Plan Note (Signed)
Symptoms consistent with AR and seasonal allergies.  Advised short term trial of antihistamine with pseudoephedrine to see if this helps.

## 2012-04-18 NOTE — Assessment & Plan Note (Signed)
Pain control adequate on curretn regiment.  Patient without insurance so would have to pay out of pocket for UDS.  Will hold off on UDS until next appt., working on Health visitor.  Hydrocodone and flexeril refilled, f/u in three months.

## 2012-06-29 ENCOUNTER — Emergency Department (INDEPENDENT_AMBULATORY_CARE_PROVIDER_SITE_OTHER)
Admission: EM | Admit: 2012-06-29 | Discharge: 2012-06-29 | Disposition: A | Discharge status: Home / Self Care | Payer: Self-pay | Source: Home / Self Care | Attending: Emergency Medicine | Admitting: Emergency Medicine

## 2012-06-29 ENCOUNTER — Encounter (HOSPITAL_COMMUNITY): Payer: Self-pay | Admitting: *Deleted

## 2012-06-29 ENCOUNTER — Emergency Department (INDEPENDENT_AMBULATORY_CARE_PROVIDER_SITE_OTHER): Payer: Self-pay

## 2012-06-29 DIAGNOSIS — W57XXXA Bitten or stung by nonvenomous insect and other nonvenomous arthropods, initial encounter: Secondary | ICD-10-CM

## 2012-06-29 DIAGNOSIS — T148 Other injury of unspecified body region: Secondary | ICD-10-CM

## 2012-06-29 DIAGNOSIS — J019 Acute sinusitis, unspecified: Secondary | ICD-10-CM

## 2012-06-29 DIAGNOSIS — J039 Acute tonsillitis, unspecified: Secondary | ICD-10-CM

## 2012-06-29 HISTORY — DX: Dorsalgia, unspecified: M54.9

## 2012-06-29 MED ORDER — DOXYCYCLINE HYCLATE 100 MG PO TABS: 100.0000 mg | ORAL_TABLET | Freq: Two times a day (BID) | ORAL | Status: AC

## 2012-06-29 MED ORDER — HYDROCODONE-ACETAMINOPHEN 5-325 MG PO TABS
ORAL_TABLET | ORAL | Status: AC
  Filled 2012-06-29: qty 2

## 2012-06-29 MED ORDER — HYDROCODONE-ACETAMINOPHEN 5-325 MG PO TABS: ORAL_TABLET | ORAL | Status: DC

## 2012-06-29 MED ORDER — HYDROCODONE-ACETAMINOPHEN 5-325 MG PO TABS
2.0000 | ORAL_TABLET | Freq: Once | ORAL | Status: AC
Start: 2012-06-29 — End: 2012-06-29
  Administered 2012-06-29: 2 via ORAL

## 2012-06-29 MED ORDER — AMOXICILLIN 500 MG PO CAPS: 1000.0000 mg | ORAL_CAPSULE | Freq: Three times a day (TID) | ORAL | Status: DC

## 2012-06-29 NOTE — ED Notes (Signed)
Pt reports  Symptoms  Of  Sinus  Congestion  sorethroat  Fever  And  Chills  Which  He  Has   Had  For  sev days    He reports  Children  Have  Been ill  Over the  Last  Several weeks

## 2012-06-29 NOTE — ED Provider Notes (Signed)
Chief Complaint  Patient presents with  . Headache    History of Present Illness:   Brent Reed is a 36 year old male who has been exposed to strep and a virus. Also about a week ago he had a tick bite on his right upper arm. Over the past 4 days he's had a fever of up to 1 and 2.5, chills, headache, photophobia, sore throat, pain on swallowing, dry cough, aching in his chest, nasal congestion with yellow-green drainage, and abdominal pain. He has not had any stiff neck or systemic rash.  Review of Systems:  Other than as noted above, the patient denies any of the following symptoms. Systemic:  No fever, chills, sweats, fatigue, myalgias, headache, or anorexia. Eye:  No redness, pain or drainage. ENT:  No earache, ear congestion, nasal congestion, sneezing, rhinorrhea, sinus pressure, sinus pain, or post nasal drip. Lungs:  No cough, sputum production, wheezing, shortness of breath, or chest pain. GI:  No abdominal pain, nausea, vomiting, or diarrhea. Skin:  No rash or itching.  PMFSH:  Past medical history, family history, social history, meds, allergies, and nurse's notes were reviewed.  There is no known exposure to strep or mono.  No prior history of step or mono.  The patient denies use of tobacco.  Physical Exam:   Vital signs:  BP 126/83  Pulse 110  Temp 98.6 F (37 C) (Oral)  Resp 20  SpO2 100% General:  Alert, in moderate distress due to headache. Eye:  No conjunctival injection or drainage. Lids were normal. ENT:  TMs and canals were normal, without erythema or inflammation.  Nasal mucosa was congested with some yellow green drainage in the right nostril.  Mucous membranes were moist.  Exam of pharynx reveals the tonsils to be enlarged, red, swollen, and had a few spots of white exudate.  There were no oral ulcerations or lesions. Neck:  Supple, no adenopathy, tenderness or mass. His neck was completely supple and he has no meningeal signs. He is able to flex his neck down touch his  chin to his chest was sitting and lying without any pain or resistance. Lungs:  No respiratory distress.  Lungs were clear to auscultation, without wheezes, rales or rhonchi.  Breath sounds were clear and equal bilaterally.  Heart:  Regular rhythm, without gallops, murmers or rubs. Skin:  Clear, warm, and dry, without rash or lesions. His tick bite site on his right upper arm is almost completely healed up. There is just a tiny area of erythema. Skin is otherwise clear.  Labs:   Results for orders placed during the hospital encounter of 06/29/12  POCT RAPID STREP A ([facility] ONLY)      Component Value Range   Streptococcus, Group A Screen (Direct) NEGATIVE  NEGATIVE   Other Labs Obtained at Urgent Care Center:  IgM antibodies [facility] spotted fever were also obtained.  Results are pending at this time and we will call about any positive results.  Course in Urgent Care Center:   He was given Norco 5/325 2 tablets by mouth and tolerated this well without any side effects and was some improvement in his headache.  Assessment:  The primary encounter diagnosis was Tonsillitis. Diagnoses of Acute sinusitis and Tick bite were also pertinent to this visit. At this point the differential diagnosis is tonsillitis, sinusitis, or a tick borne illness. Since his rapid strep is negative, we'll go ahead with doxycycline treatment and he was given hydrocodone for the pain. He does have  a pain contract and his primary care physician's office, but I thought he needed something a little additional for the pain since he does appear to be very uncomfortable.  Plan:   1.  The following meds were prescribed:   New Prescriptions   DOXYCYCLINE (VIBRA-TABS) 100 MG TABLET    Take 1 tablet (100 mg total) by mouth 2 (two) times daily.   HYDROCODONE-ACETAMINOPHEN (NORCO) 5-325 MG PER TABLET    1 to 2 tabs every 4 to 6 hours as needed for pain.   2.  The patient was instructed in symptomatic care including hot  saline gargles, throat lozenges, infectious precautions, and need to trade out toothbrush. Handouts were given. 3.  The patient was told to return if becoming worse in any way, if no better in 3 or 4 days, and given some red flag symptoms that would indicate earlier return.    Reuben Likes, MD 06/29/12 [phone]

## 2012-06-30 ENCOUNTER — Telehealth (HOSPITAL_COMMUNITY): Payer: Self-pay | Admitting: *Deleted

## 2012-06-30 LAB — ROCKY MTN SPOTTED FVR AB, IGM-BLOOD: RMSF IgM: 1.14 IV (ref 0.00–0.89)

## 2012-06-30 NOTE — ED Notes (Signed)
1439  Lab called a critical value on RMSF IgM 1.14 HH.  Lab shown to Dr. Lorenz Coaster.  Pt. adequately treated with Doxycycline. I called and left a message for pt. to call.

## 2012-07-05 ENCOUNTER — Telehealth (HOSPITAL_COMMUNITY): Payer: Self-pay | Admitting: *Deleted

## 2012-07-05 NOTE — ED Notes (Signed)
7/5 Pt. called back on VM. I called pt. back. Pt. verified x 2 and given results. RMSF 1.14 H.  Pt. told it is positive for RMSF.  Pt. instructed to finish all of Doxycycline. Get rechecked if not better after finishing his antibiotic.  7/9 I called pt. to make sure I had given him his results. He said yes. He said he is getting better and is still taking his antibiotics. He asked about his strep test. I told him the strep screen was neg. Vassie Moselle 07/05/2012

## 2012-07-13 ENCOUNTER — Emergency Department (HOSPITAL_COMMUNITY)
Admission: EM | Admit: 2012-07-13 | Discharge: 2012-07-14 | Disposition: A | Discharge status: Home / Self Care | Payer: Self-pay | Attending: Emergency Medicine | Admitting: Emergency Medicine

## 2012-07-13 ENCOUNTER — Encounter (HOSPITAL_COMMUNITY): Payer: Self-pay | Admitting: Emergency Medicine

## 2012-07-13 DIAGNOSIS — R600 Localized edema: Secondary | ICD-10-CM

## 2012-07-13 DIAGNOSIS — I10 Essential (primary) hypertension: Secondary | ICD-10-CM | POA: Insufficient documentation

## 2012-07-13 DIAGNOSIS — M25476 Effusion, unspecified foot: Secondary | ICD-10-CM | POA: Insufficient documentation

## 2012-07-13 DIAGNOSIS — M25473 Effusion, unspecified ankle: Secondary | ICD-10-CM | POA: Insufficient documentation

## 2012-07-13 NOTE — ED Notes (Signed)
Pt has been treated for cellulitis in past

## 2012-07-13 NOTE — ED Notes (Signed)
Phlebotomy at bedside to obtain labs.

## 2012-07-13 NOTE — ED Notes (Signed)
Pt c/o RLE swelling. Pt states he was stung by yellow jacket Sunday near knee on R leg. Swelling noted to foot and ankle

## 2012-07-13 NOTE — ED Provider Notes (Signed)
History     CSN: [id number]  Arrival date & time 07/13/12  2053   First MD Initiated Contact with Patient 07/13/12 2211      Chief Complaint  Patient presents with  . Leg Swelling    (Consider location/radiation/quality/duration/timing/severity/associated sxs/prior treatment) HPI Comments: Patient presenting with a chief complaint of RLE pain.  Pain located over the calf and the shin.  He reports that he began having the pain last evening and that the pain is gradually worsening.  He has taken Hydrocodone for the pain, but does not feel that it is helping.  He has also noticed some swelling of both of his lower legs, particularly around his ankle.  Swelling worse on the right.  He denies fever or chills.  No erythema or warmth.  No numbness or tingling.  Full ROM of both ankles.    The history is provided by the patient.    Past Medical History  Diagnosis Date  . Seizures     previously on tegretol, thought to be secondary to Ultram on one occurance  . Hypertension   . Chronic pain     back pain  . 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 Back pain     Past Surgical History  Procedure Date  . Back surgery     No family history on file.  History  Substance Use Topics  . Smoking status: Current Everyday Smoker -- 0.8 packs/day    Types: Cigarettes  . Smokeless tobacco: Never Used  . Alcohol Use: No      Review of Systems  Constitutional: Negative for fever and chills.  Respiratory: Negative for shortness of breath.   Cardiovascular: Negative for chest pain.  Musculoskeletal: Positive for joint swelling. Negative for gait problem.  Skin: Negative for color change and wound.  Neurological: Negative for weakness and numbness.    Allergies  Bupropion hcl; Sulfamethoxazole w-trimethoprim; and Tramadol hcl  Home Medications   Current Outpatient Rx  Name Route Sig Dispense Refill  . CYCLOBENZAPRINE HCL 10 MG PO TABS Oral Take 1 tablet (10 mg total)  by mouth 3 (three) times daily as needed. for spasm 90 tablet 3  . HYDROCHLOROTHIAZIDE 25 MG PO TABS Oral Take 1 tablet (25 mg total) by mouth daily. 30 tablet 6  . HYDROCODONE-ACETAMINOPHEN 10-500 MG PO TABS Oral Take 1 tablet by mouth every 6 (six) hours as needed. pain 120 tablet 3    BP 123/85  Pulse 74  Temp 98.7 F (37.1 C) (Oral)  Resp 22  Wt 185 lb 8 oz (84.142 kg)  SpO2 100%  Physical Exam  Nursing note and vitals reviewed. Constitutional: He appears well-developed and well-nourished. No distress.  HENT:  Head: Normocephalic and atraumatic.  Mouth/Throat: Oropharynx is clear and moist.  Neck: Normal range of motion. Neck supple.  Cardiovascular: Normal rate, regular rhythm and normal heart sounds.   Pulmonary/Chest: Effort normal and breath sounds normal.  Musculoskeletal: Normal range of motion.       Swelling of both ankles.  Swelling worse on the right.  Full ROM of both ankles.  Neurological: He is alert. No sensory deficit. Gait normal.  Skin: Skin is warm and dry. He is not diaphoretic.       No erythema or warmth to the touch.  Psychiatric: He has a normal mood and affect.    ED Course  Procedures (including critical care time)   Labs Reviewed  D-DIMER, QUANTITATIVE   No  results found.   No diagnosis found.    MDM  Patient presenting with lower extremity pain and swelling, worse on the right.  D-dimer negative.  No erythema or warmth of the skin.  Patient afebrile.  Sensation intact.  Therefore, feel that patient can follow up with his PCP.          Pascal Lux [place], PA-C 07/15/12 0121

## 2012-07-15 NOTE — ED Provider Notes (Signed)
Medical screening examination/treatment/procedure(s) were performed by non-physician practitioner and as supervising physician I was immediately available for consultation/collaboration. Mennie Spiller, MD, FACEP   Sorrel Cassetta L Ann Bohne, MD 07/15/12 2038 

## 2012-08-16 ENCOUNTER — Ambulatory Visit (INDEPENDENT_AMBULATORY_CARE_PROVIDER_SITE_OTHER): Payer: Self-pay | Admitting: Family Medicine

## 2012-08-16 ENCOUNTER — Encounter: Payer: Self-pay | Admitting: Family Medicine

## 2012-08-16 VITALS — BP 164/103 | HR 76 | Ht 70.0 in | Wt 180.0 lb

## 2012-08-16 DIAGNOSIS — IMO0002 Reserved for concepts with insufficient information to code with codable children: Secondary | ICD-10-CM

## 2012-08-16 DIAGNOSIS — G8929 Other chronic pain: Secondary | ICD-10-CM

## 2012-08-16 DIAGNOSIS — M549 Dorsalgia, unspecified: Secondary | ICD-10-CM

## 2012-08-16 DIAGNOSIS — I10 Essential (primary) hypertension: Secondary | ICD-10-CM

## 2012-08-16 DIAGNOSIS — G894 Chronic pain syndrome: Secondary | ICD-10-CM

## 2012-08-16 MED ORDER — HYDROCODONE-ACETAMINOPHEN 10-325 MG PO TABS: 1.0000 | ORAL_TABLET | Freq: Four times a day (QID) | ORAL | Status: DC | PRN

## 2012-08-16 MED ORDER — CYCLOBENZAPRINE HCL 10 MG PO TABS: 10.0000 mg | ORAL_TABLET | Freq: Three times a day (TID) | ORAL | Status: DC | PRN

## 2012-08-16 MED ORDER — HYDROCODONE-ACETAMINOPHEN 10-325 MG PO TABS: 1.0000 | ORAL_TABLET | Freq: Four times a day (QID) | ORAL | Status: AC | PRN

## 2012-08-17 LAB — DRUG SCR UR, PAIN MGMT, REFLEX CONF
Barbiturate Quant, Ur: NEGATIVE
Cocaine Metabolites: NEGATIVE
Creatinine,U: 58.99 mg/dL
Opiates: NEGATIVE
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
## 2012-08-21 NOTE — Assessment & Plan Note (Signed)
Pressure elevated, possibly due to pain.  If remains elevated may need addition of 2nd antihypertensive agent.

## 2012-08-21 NOTE — Assessment & Plan Note (Signed)
Refilled pain medications, to be filled on schedule.  Check UDS today.

## 2012-12-29 ENCOUNTER — Ambulatory Visit (INDEPENDENT_AMBULATORY_CARE_PROVIDER_SITE_OTHER): Payer: Self-pay | Admitting: Family Medicine

## 2012-12-29 ENCOUNTER — Encounter: Payer: Self-pay | Admitting: Family Medicine

## 2012-12-29 VITALS — BP 170/100 | HR 84 | Wt 181.7 lb

## 2012-12-29 DIAGNOSIS — G8929 Other chronic pain: Secondary | ICD-10-CM

## 2012-12-29 DIAGNOSIS — IMO0002 Reserved for concepts with insufficient information to code with codable children: Secondary | ICD-10-CM

## 2012-12-29 DIAGNOSIS — M549 Dorsalgia, unspecified: Secondary | ICD-10-CM

## 2012-12-29 DIAGNOSIS — G894 Chronic pain syndrome: Secondary | ICD-10-CM

## 2012-12-29 DIAGNOSIS — I10 Essential (primary) hypertension: Secondary | ICD-10-CM

## 2012-12-29 MED ORDER — CYCLOBENZAPRINE HCL 10 MG PO TABS: 10.0000 mg | ORAL_TABLET | Freq: Three times a day (TID) | ORAL | Status: DC | PRN

## 2012-12-29 MED ORDER — HYDROCODONE-ACETAMINOPHEN 10-325 MG PO TABS: 1.0000 | ORAL_TABLET | Freq: Four times a day (QID) | ORAL | Status: DC | PRN

## 2012-12-29 MED ORDER — PREDNISONE 20 MG PO TABS: 40.0000 mg | ORAL_TABLET | Freq: Every day | ORAL | Status: DC

## 2012-12-29 MED ORDER — HYDROCHLOROTHIAZIDE 25 MG PO TABS: 25.0000 mg | ORAL_TABLET | Freq: Every day | ORAL | Status: DC

## 2012-12-29 NOTE — Patient Instructions (Addendum)

## 2013-01-01 NOTE — Assessment & Plan Note (Signed)
Acute on chronic exacerbation of radicular pain.  Refilled narcotics and muscle relaxants.  Will also do a short course of steroids to see if this is helpful for his pain as well.  Will see him back in 2 weeks.  If not improving may consider MRI, although he does not have insurance.

## 2013-01-01 NOTE — Progress Notes (Signed)
  Subjective:    Patient ID: Brent Reed, male    DOB: 05/16/1976, 37 y.o.   MRN: [id number]  HPI 1. Back pain:  Here for acute on chronic pain exacerbation.  Patient has diagnosis of chronic back pain, currently treated with narcotics and muscle relaxants.  States that over the weekend he had picked up his son and felt a "pop" in his lower back.  Since that time he has noticed more spasm and endorses "burning and numbness" down the back of his R leg.  He is currently out of his flexeril and hydrocodone.  Denies weakness in the extremity, bowel or bladder incontinence.   2. HTN:  Treated with HCTZ.  He is out of this medication, but is compliant when he has it.  Does not check BP at home.  Denies headache, vision changes, chest pain, or sob.    Review of Systems Per HPI    Objective:   Physical Exam  Constitutional: He appears well-nourished. No distress.  HENT:  Head: Normocephalic and atraumatic.  Neck: Neck supple.  Cardiovascular: Normal rate.   Musculoskeletal: He exhibits no edema.       ROM limited 2/2 to pain especially with flexion and extension.  He climbs to the exam table slowly. Some tenderness along lower back muscles.  NO bony tenderness.  SLR is + on the R.  Pearlean Brownie with some pain, but difficult to localize.  DTR's 2+ bilaterally.           Assessment & Plan:

## 2013-01-01 NOTE — Assessment & Plan Note (Signed)
BP elevated today but out of medication and having increased pain.  Will recheck at return visit.  No red flags today.

## 2013-01-13 ENCOUNTER — Ambulatory Visit: Payer: Self-pay | Admitting: Family Medicine

## 2013-01-31 ENCOUNTER — Ambulatory Visit: Payer: Self-pay | Admitting: Family Medicine

## 2013-02-17 ENCOUNTER — Encounter: Payer: Self-pay | Admitting: Family Medicine

## 2013-02-17 ENCOUNTER — Ambulatory Visit (INDEPENDENT_AMBULATORY_CARE_PROVIDER_SITE_OTHER): Payer: Self-pay | Admitting: Family Medicine

## 2013-02-17 VITALS — BP 140/93 | HR 105 | Temp 99.1°F | Ht 70.0 in | Wt 179.0 lb

## 2013-02-17 MED ORDER — HYDROCOD POLST-CHLORPHEN POLST 10-8 MG/5ML PO LQCR: 5.0000 mL | Freq: Every evening | ORAL | Status: DC | PRN

## 2013-02-17 MED ORDER — ALBUTEROL SULFATE HFA 108 (90 BASE) MCG/ACT IN AERS: 2.0000 | INHALATION_SPRAY | RESPIRATORY_TRACT | Status: DC | PRN

## 2013-02-17 NOTE — Assessment & Plan Note (Signed)
Patient with what appears to be acute-onset bronchitis in a smoker.  His most distressing symptom at this time is the nighttime cough.  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 I have explained to him why I do not believe that antibiotic prescription is indicated at this time; supportive measures such as mucolytics that may be helpful in his recovery.  He is most distressed by nighttime cough and states that Tussionex has been a helpful cough medication for him in the past.  He acknowledges his Controlled Substances Contract with Dr Ashley Royalty, and I have agreed to prescribe him a limited amount of Tussionex as a cough suppressant during this acute illness, which should not be seen as a violation of his contract.  Parameters for return phone call or office follow-up established.

## 2013-02-17 NOTE — Patient Instructions (Addendum)
It was a pleasure to see you today.  I believe your symptoms are related to acute bronchitis.    I recommend Mucinex 600mg  tablets, take 2 tablets together, every 12 hours, to thin out your secretions.  Albuterol inhaler, 2 puffs by mouth every 4 hours as needed.   I am giving you a prescription for Tussionex cough syrup, which is a controlled substance.  Take 1 teaspoon at bedtime only as needed for nighttime cough.  I am aware that you have a Controlled Substance Contract with Dr Ashley Royalty and I will be sure he is aware of the reason for this medication in the setting of this illness.  Please call back if you are getting worse, if you develop a measurable fever, if you are having more trouble breathing, or with other questions.

## 2013-02-17 NOTE — Progress Notes (Signed)
  Subjective:    Patient ID: Brent Reed, male    DOB: 1976-02-26, 37 y.o.   MRN: [id number]  HPI Patient here for same-day appointment for onset of fatigue which began on Monday, Feb 17th and had been preceded by a mild cough which has become heavier and non-productive in the past 3 days.  No measurable fever at home.  Has a history of exercise-induced asthma as a teen, but has not had a flare recently and does not have his own albuterol HFA.  He did use his asthmatic daughter's albuterol once this week.  Usually smokes between 1/2 and 1ppd cigarettes, but has been smoking much less since onset of illness.   Has had clear nasal secretions.  Denies headache.  Reports that his most bothersome symptom has been nighttime cough.  In the past he has taken Tussionex which has been useful for the cough.    Discussed his past medical history including chronic back pain and use of opioids on Controlled Substance Contract for this.  Patient is clear that he is not asking for opioid medications in violation of his contract.   Review of Systems See above. Denies diarrhoea or vomiting.  Two of his three children have had GI illness with vomiting at home.     Objective:   Physical Exam Mildly ill appearing, but in no acute distress HEENT Neck supple. TMs clear bilaterally. Injected nasal mucosa.  Cobblestoning oropharynx with injection but no exudate. No frontal or maxillary sinus tenderness PULM Clear bilaterally without wheezes or rales.  COR Regular S1S2.        Assessment & Plan:

## 2013-03-24 ENCOUNTER — Encounter: Payer: Self-pay | Admitting: Family Medicine

## 2013-03-24 ENCOUNTER — Ambulatory Visit (INDEPENDENT_AMBULATORY_CARE_PROVIDER_SITE_OTHER): Payer: Medicaid Other | Admitting: Family Medicine

## 2013-03-24 VITALS — BP 153/96 | HR 102 | Ht 70.0 in | Wt 196.0 lb

## 2013-03-24 DIAGNOSIS — L237 Allergic contact dermatitis due to plants, except food: Secondary | ICD-10-CM

## 2013-03-24 DIAGNOSIS — I10 Essential (primary) hypertension: Secondary | ICD-10-CM

## 2013-03-24 DIAGNOSIS — L255 Unspecified contact dermatitis due to plants, except food: Secondary | ICD-10-CM

## 2013-03-24 DIAGNOSIS — G894 Chronic pain syndrome: Secondary | ICD-10-CM

## 2013-03-24 MED ORDER — AMLODIPINE BESYLATE 5 MG PO TABS: 5.0000 mg | ORAL_TABLET | Freq: Every day | ORAL | Status: DC

## 2013-03-24 MED ORDER — HYDROCODONE-ACETAMINOPHEN 10-325 MG PO TABS: 1.0000 | ORAL_TABLET | Freq: Four times a day (QID) | ORAL | Status: DC | PRN

## 2013-03-24 MED ORDER — PREDNISONE 10 MG PO TABS: ORAL_TABLET | ORAL | Status: DC

## 2013-03-24 NOTE — Patient Instructions (Signed)
Thank you for coming in today, it was good to see you I have sent in a steroid for your poison ivy Start the new medication for blood pressure called amlodipine Follow up in 4 weeks with me

## 2013-03-27 DIAGNOSIS — L237 Allergic contact dermatitis due to plants, except food: Secondary | ICD-10-CM | POA: Insufficient documentation

## 2013-03-27 NOTE — Assessment & Plan Note (Signed)
Rash consistent with exposure to poison ivy.  Will treat with steroid taper.  Follow up if not improving.
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
## 2013-03-27 NOTE — Assessment & Plan Note (Signed)
BP has remained elevated at several appointments.  Will add on amlodipine.

## 2013-03-27 NOTE — Assessment & Plan Note (Signed)
Chronic pain, has tried multiple treatments in the past.  No signs of abuse.  Refilled norco.  He is on medication contract and I am aware of occasional MJ use that he has been upfront about that he does not see himself stopping this.

## 2013-06-23 ENCOUNTER — Ambulatory Visit (INDEPENDENT_AMBULATORY_CARE_PROVIDER_SITE_OTHER): Payer: Medicaid Other | Admitting: Family Medicine

## 2013-06-23 VITALS — BP 149/91 | HR 82 | Ht 70.0 in | Wt 193.0 lb

## 2013-06-23 DIAGNOSIS — G894 Chronic pain syndrome: Secondary | ICD-10-CM

## 2013-06-23 DIAGNOSIS — M549 Dorsalgia, unspecified: Secondary | ICD-10-CM

## 2013-06-23 DIAGNOSIS — I10 Essential (primary) hypertension: Secondary | ICD-10-CM

## 2013-06-23 DIAGNOSIS — IMO0002 Reserved for concepts with insufficient information to code with codable children: Secondary | ICD-10-CM

## 2013-06-23 DIAGNOSIS — G8929 Other chronic pain: Secondary | ICD-10-CM

## 2013-06-23 MED ORDER — HYDROCODONE-ACETAMINOPHEN 10-325 MG PO TABS: 1.0000 | ORAL_TABLET | Freq: Four times a day (QID) | ORAL | Status: DC | PRN

## 2013-06-23 MED ORDER — CYCLOBENZAPRINE HCL 10 MG PO TABS: 10.0000 mg | ORAL_TABLET | Freq: Three times a day (TID) | ORAL | Status: DC | PRN

## 2013-06-25 NOTE — Assessment & Plan Note (Signed)
Pain well controlled with current regimen.  Refilled hydrocodone and flexeril.  WOuld like to go to pain management again once he has medicaid.
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
## 2013-06-25 NOTE — Assessment & Plan Note (Signed)
BP elevated today, however he is getting good readings at home.  I will leave him off bp meds for now and have him record the numbers he is getting at home.  He will return in one month for his new pcp to review this.  If still some discrepancy can consider referral to Dr. Raymondo Band for 24 hour ambulatory BP monitoring.

## 2014-07-10 ENCOUNTER — Other Ambulatory Visit: Payer: Self-pay | Admitting: Neurosurgery

## 2014-07-12 ENCOUNTER — Other Ambulatory Visit (HOSPITAL_COMMUNITY): Payer: Self-pay | Admitting: *Deleted

## 2014-07-13 ENCOUNTER — Encounter (HOSPITAL_COMMUNITY): Payer: Self-pay | Admitting: *Deleted

## 2014-07-13 ENCOUNTER — Encounter (HOSPITAL_COMMUNITY): Payer: Self-pay | Admitting: Pharmacy Technician

## 2014-07-13 ENCOUNTER — Inpatient Hospital Stay (HOSPITAL_COMMUNITY): Payer: Medicaid Other

## 2014-07-13 ENCOUNTER — Encounter (HOSPITAL_COMMUNITY): Payer: Medicaid Other | Admitting: Anesthesiology

## 2014-07-13 ENCOUNTER — Inpatient Hospital Stay (HOSPITAL_COMMUNITY)
Admission: AD | Admit: 2014-07-13 | Discharge: 2014-07-17 | DRG: 460 | Disposition: A | Discharge status: Home / Self Care | Payer: Medicaid Other | Source: Ambulatory Visit | Attending: Neurosurgery | Admitting: Neurosurgery

## 2014-07-13 ENCOUNTER — Inpatient Hospital Stay (HOSPITAL_COMMUNITY): Payer: Medicaid Other | Admitting: Anesthesiology

## 2014-07-13 ENCOUNTER — Encounter (HOSPITAL_COMMUNITY)
Admission: AD | Disposition: A | Discharge status: Home / Self Care | Payer: Medicaid Other | Source: Ambulatory Visit | Attending: Neurosurgery

## 2014-07-13 DIAGNOSIS — M47817 Spondylosis without myelopathy or radiculopathy, lumbosacral region: Secondary | ICD-10-CM | POA: Diagnosis present

## 2014-07-13 DIAGNOSIS — K219 Gastro-esophageal reflux disease without esophagitis: Secondary | ICD-10-CM | POA: Diagnosis present

## 2014-07-13 DIAGNOSIS — R112 Nausea with vomiting, unspecified: Secondary | ICD-10-CM | POA: Diagnosis not present

## 2014-07-13 DIAGNOSIS — M549 Dorsalgia, unspecified: Secondary | ICD-10-CM | POA: Diagnosis present

## 2014-07-13 DIAGNOSIS — F17200 Nicotine dependence, unspecified, uncomplicated: Secondary | ICD-10-CM | POA: Diagnosis present

## 2014-07-13 DIAGNOSIS — M5137 Other intervertebral disc degeneration, lumbosacral region: Secondary | ICD-10-CM | POA: Diagnosis present

## 2014-07-13 DIAGNOSIS — M51379 Other intervertebral disc degeneration, lumbosacral region without mention of lumbar back pain or lower extremity pain: Secondary | ICD-10-CM | POA: Diagnosis present

## 2014-07-13 DIAGNOSIS — M5126 Other intervertebral disc displacement, lumbar region: Principal | ICD-10-CM | POA: Diagnosis present

## 2014-07-13 DIAGNOSIS — Z9089 Acquired absence of other organs: Secondary | ICD-10-CM

## 2014-07-13 DIAGNOSIS — Z888 Allergy status to other drugs, medicaments and biological substances status: Secondary | ICD-10-CM | POA: Diagnosis not present

## 2014-07-13 HISTORY — DX: Personal history of other diseases of the digestive system: Z87.19

## 2014-07-13 HISTORY — DX: Gastro-esophageal reflux disease without esophagitis: K21.9

## 2014-07-13 LAB — ABO/RH: ABO/RH(D): O POS

## 2014-07-13 LAB — BASIC METABOLIC PANEL
Anion gap: 17 — ABNORMAL HIGH (ref 5–15)
BUN: 17 mg/dL (ref 6–23)
CHLORIDE: 99 meq/L (ref 96–112)
CO2: 23 meq/L (ref 19–32)
Calcium: 9.4 mg/dL (ref 8.4–10.5)
Creatinine, Ser: 0.91 mg/dL (ref 0.50–1.35)
GFR calc Af Amer: >90 mL/min (ref >90)
GFR calc non Af Amer: >90 mL/min (ref >90)
Glucose, Bld: 91 mg/dL (ref 70–99)
POTASSIUM: 4 meq/L (ref 3.7–5.3)
Sodium: 139 mEq/L (ref 137–147)

## 2014-07-13 LAB — CBC
HCT: 45.1 % (ref 39.0–52.0)
Hemoglobin: 15.6 g/dL (ref 13.0–17.0)
MCH: 32.2 pg (ref 26.0–34.0)
MCHC: 34.6 g/dL (ref 30.0–36.0)
MCV: 93 fL (ref 78.0–100.0)
Platelets: 282 10*3/uL (ref 150–400)
RBC: 4.85 MIL/uL (ref 4.22–5.81)
RDW: 13.5 % (ref 11.5–15.5)
WBC: 10.8 10*3/uL — ABNORMAL HIGH (ref 4.0–10.5)

## 2014-07-13 LAB — TYPE AND SCREEN
ABO/RH(D): O POS
Antibody Screen: NEGATIVE

## 2014-07-13 LAB — SURGICAL PCR SCREEN
MRSA, PCR: NEGATIVE
STAPHYLOCOCCUS AUREUS: NEGATIVE

## 2014-07-13 SURGERY — POSTERIOR LUMBAR FUSION 2 LEVEL
Anesthesia: General | Site: Back

## 2014-07-13 MED ORDER — LACTATED RINGERS IV SOLN
INTRAVENOUS | Status: DC
  Administered 2014-07-13: 10:00:00 via INTRAVENOUS

## 2014-07-13 MED ORDER — MIDAZOLAM HCL 2 MG/2ML IJ SOLN
INTRAMUSCULAR | Status: AC
  Filled 2014-07-13: qty 2

## 2014-07-13 MED ORDER — ROCURONIUM BROMIDE 100 MG/10ML IV SOLN
INTRAVENOUS | Status: DC | PRN
  Administered 2014-07-13: 50 mg via INTRAVENOUS

## 2014-07-13 MED ORDER — PHENOL 1.4 % MT LIQD: 1.0000 | OROMUCOSAL | Status: DC | PRN

## 2014-07-13 MED ORDER — BUPIVACAINE HCL (PF) 0.25 % IJ SOLN
INTRAMUSCULAR | Status: DC | PRN
  Administered 2014-07-13: 8 mL

## 2014-07-13 MED ORDER — ONDANSETRON HCL 4 MG/2ML IJ SOLN
4.0000 mg | INTRAMUSCULAR | Status: DC | PRN
  Administered 2014-07-13 – 2014-07-16 (×3): 4 mg via INTRAVENOUS
  Filled 2014-07-13 (×3): qty 2

## 2014-07-13 MED ORDER — THROMBIN 20000 UNITS EX SOLR
CUTANEOUS | Status: DC | PRN
  Administered 2014-07-13 (×2): via TOPICAL

## 2014-07-13 MED ORDER — 0.9 % SODIUM CHLORIDE (POUR BTL) OPTIME
TOPICAL | Status: DC | PRN
  Administered 2014-07-13: 1000 mL

## 2014-07-13 MED ORDER — ACETAMINOPHEN 650 MG RE SUPP: 650.0000 mg | RECTAL | Status: DC | PRN

## 2014-07-13 MED ORDER — VECURONIUM BROMIDE 10 MG IV SOLR: 50.0000 mg | INTRAVENOUS | Status: DC | PRN

## 2014-07-13 MED ORDER — HYDROMORPHONE HCL PF 1 MG/ML IJ SOLN
INTRAMUSCULAR | Status: AC
Start: 2014-07-13 — End: 2014-07-14
  Administered 2014-07-14: 1 mg via INTRAVENOUS
  Filled 2014-07-13: qty 1

## 2014-07-13 MED ORDER — ONDANSETRON HCL 4 MG/2ML IJ SOLN
INTRAMUSCULAR | Status: AC
  Filled 2014-07-13: qty 2

## 2014-07-13 MED ORDER — MEPERIDINE HCL 25 MG/ML IJ SOLN
6.2500 mg | INTRAMUSCULAR | Status: DC | PRN
Start: 2014-07-13 — End: 2014-07-13

## 2014-07-13 MED ORDER — ONDANSETRON HCL 4 MG/2ML IJ SOLN: 4.0000 mg | Freq: Once | INTRAMUSCULAR | Status: DC | PRN

## 2014-07-13 MED ORDER — OXYCODONE HCL 5 MG PO TABS
5.0000 mg | ORAL_TABLET | Freq: Once | ORAL | Status: AC | PRN
  Administered 2014-07-13: 5 mg via ORAL

## 2014-07-13 MED ORDER — MUPIROCIN 2 % EX OINT
TOPICAL_OINTMENT | CUTANEOUS | Status: AC
  Administered 2014-07-13: 1 via NASAL
  Filled 2014-07-13: qty 22

## 2014-07-13 MED ORDER — GLYCOPYRROLATE 0.2 MG/ML IJ SOLN
INTRAMUSCULAR | Status: AC
  Filled 2014-07-13: qty 3

## 2014-07-13 MED ORDER — LACTATED RINGERS IV SOLN
INTRAVENOUS | Status: DC | PRN
  Administered 2014-07-13 (×2): via INTRAVENOUS

## 2014-07-13 MED ORDER — HYDROMORPHONE HCL PF 1 MG/ML IJ SOLN
INTRAMUSCULAR | Status: AC
  Filled 2014-07-13: qty 1

## 2014-07-13 MED ORDER — FENTANYL CITRATE 0.05 MG/ML IJ SOLN
1500.0000 ug | INTRAMUSCULAR | Status: DC | PRN
  Administered 2014-07-13: 3 ug/kg/h via INTRAVENOUS

## 2014-07-13 MED ORDER — ROCURONIUM BROMIDE 50 MG/5ML IV SOLN
INTRAVENOUS | Status: AC
  Filled 2014-07-13: qty 1

## 2014-07-13 MED ORDER — FENTANYL CITRATE 0.05 MG/ML IJ SOLN
INTRAMUSCULAR | Status: AC
  Filled 2014-07-13: qty 5

## 2014-07-13 MED ORDER — CEFAZOLIN SODIUM-DEXTROSE 2-3 GM-% IV SOLR
INTRAVENOUS | Status: AC
  Administered 2014-07-13: 2 g via INTRAVENOUS
  Filled 2014-07-13: qty 50

## 2014-07-13 MED ORDER — VECURONIUM BROMIDE 10 MG IV SOLR
50.0000 mg | INTRAVENOUS | Status: DC | PRN
  Administered 2014-07-13: .05 mg/kg/h via INTRAVENOUS

## 2014-07-13 MED ORDER — HYDROCODONE-ACETAMINOPHEN 10-325 MG PO TABS
1.0000 | ORAL_TABLET | Freq: Four times a day (QID) | ORAL | Status: DC | PRN
  Administered 2014-07-14: 1 via ORAL
  Filled 2014-07-13: qty 1

## 2014-07-13 MED ORDER — ACETAMINOPHEN 325 MG PO TABS: 650.0000 mg | ORAL_TABLET | ORAL | Status: DC | PRN

## 2014-07-13 MED ORDER — CYCLOBENZAPRINE HCL 10 MG PO TABS
10.0000 mg | ORAL_TABLET | Freq: Three times a day (TID) | ORAL | Status: DC | PRN
  Administered 2014-07-13 – 2014-07-17 (×9): 10 mg via ORAL
  Filled 2014-07-13 (×8): qty 1

## 2014-07-13 MED ORDER — CEFAZOLIN SODIUM 1-5 GM-% IV SOLN
1.0000 g | Freq: Three times a day (TID) | INTRAVENOUS | Status: AC
  Administered 2014-07-13 – 2014-07-14 (×2): 1 g via INTRAVENOUS
  Filled 2014-07-13 (×2): qty 50

## 2014-07-13 MED ORDER — SODIUM CHLORIDE 0.9 % IR SOLN
Status: DC | PRN
  Administered 2014-07-13: 14:00:00

## 2014-07-13 MED ORDER — MIDAZOLAM HCL 5 MG/5ML IJ SOLN
INTRAMUSCULAR | Status: DC | PRN
  Administered 2014-07-13: 2 mg via INTRAVENOUS

## 2014-07-13 MED ORDER — CYCLOBENZAPRINE HCL 10 MG PO TABS
ORAL_TABLET | ORAL | Status: AC
  Filled 2014-07-13: qty 1

## 2014-07-13 MED ORDER — HYDROMORPHONE HCL PF 1 MG/ML IJ SOLN
0.2500 mg | INTRAMUSCULAR | Status: DC | PRN
  Administered 2014-07-13 (×4): 0.5 mg via INTRAVENOUS

## 2014-07-13 MED ORDER — LIDOCAINE-EPINEPHRINE 1 %-1:100000 IJ SOLN
INTRAMUSCULAR | Status: DC | PRN
  Administered 2014-07-13: 10 mL

## 2014-07-13 MED ORDER — LIDOCAINE HCL (CARDIAC) 20 MG/ML IV SOLN
INTRAVENOUS | Status: DC | PRN
  Administered 2014-07-13: 100 mg via INTRAVENOUS

## 2014-07-13 MED ORDER — LIDOCAINE HCL (CARDIAC) 20 MG/ML IV SOLN
INTRAVENOUS | Status: AC
  Filled 2014-07-13: qty 5

## 2014-07-13 MED ORDER — OXYCODONE HCL 5 MG PO TABS
ORAL_TABLET | ORAL | Status: AC
  Filled 2014-07-13: qty 1

## 2014-07-13 MED ORDER — MENTHOL 3 MG MT LOZG: 1.0000 | LOZENGE | OROMUCOSAL | Status: DC | PRN

## 2014-07-13 MED ORDER — HYDROMORPHONE HCL PF 1 MG/ML IJ SOLN
0.5000 mg | INTRAMUSCULAR | Status: DC | PRN
  Administered 2014-07-13 – 2014-07-16 (×21): 1 mg via INTRAVENOUS
  Filled 2014-07-13 (×21): qty 1

## 2014-07-13 MED ORDER — GLYCOPYRROLATE 0.2 MG/ML IJ SOLN
INTRAMUSCULAR | Status: DC | PRN
  Administered 2014-07-13: 0.6 mg via INTRAVENOUS

## 2014-07-13 MED ORDER — ONDANSETRON HCL 4 MG/2ML IJ SOLN
INTRAMUSCULAR | Status: DC | PRN
Start: 2014-07-13 — End: 2014-07-13
  Administered 2014-07-13: 4 mg via INTRAVENOUS

## 2014-07-13 MED ORDER — PROPOFOL 10 MG/ML IV BOLUS
INTRAVENOUS | Status: AC
  Filled 2014-07-13: qty 20

## 2014-07-13 MED ORDER — SODIUM CHLORIDE 0.9 % IV SOLN
250.0000 mL | INTRAVENOUS | Status: DC
  Administered 2014-07-13: 1000 mL via INTRAVENOUS

## 2014-07-13 MED ORDER — FENTANYL CITRATE 0.05 MG/ML IJ SOLN
INTRAMUSCULAR | Status: DC | PRN
  Administered 2014-07-13: 250 ug via INTRAVENOUS

## 2014-07-13 MED ORDER — SODIUM CHLORIDE 0.9 % IJ SOLN
3.0000 mL | INTRAMUSCULAR | Status: DC | PRN
  Administered 2014-07-14: 3 mL via INTRAVENOUS

## 2014-07-13 MED ORDER — MUPIROCIN 2 % EX OINT
TOPICAL_OINTMENT | Freq: Two times a day (BID) | CUTANEOUS | Status: DC
  Administered 2014-07-14 – 2014-07-17 (×8): via NASAL
  Filled 2014-07-13 (×2): qty 22

## 2014-07-13 MED ORDER — PROPOFOL 10 MG/ML IV BOLUS
INTRAVENOUS | Status: DC | PRN
  Administered 2014-07-13: 130 mg via INTRAVENOUS

## 2014-07-13 MED ORDER — DOCUSATE SODIUM 100 MG PO CAPS
100.0000 mg | ORAL_CAPSULE | Freq: Two times a day (BID) | ORAL | Status: DC
  Administered 2014-07-13 – 2014-07-17 (×8): 100 mg via ORAL
  Filled 2014-07-13 (×7): qty 1

## 2014-07-13 MED ORDER — NEOSTIGMINE METHYLSULFATE 10 MG/10ML IV SOLN
INTRAVENOUS | Status: DC | PRN
  Administered 2014-07-13: 4 mg via INTRAVENOUS

## 2014-07-13 MED ORDER — SODIUM CHLORIDE 0.9 % IJ SOLN
3.0000 mL | Freq: Two times a day (BID) | INTRAMUSCULAR | Status: DC
  Administered 2014-07-14 – 2014-07-17 (×3): 3 mL via INTRAVENOUS

## 2014-07-13 MED ORDER — NEOSTIGMINE METHYLSULFATE 10 MG/10ML IV SOLN
INTRAVENOUS | Status: AC
  Filled 2014-07-13: qty 1

## 2014-07-13 MED ORDER — OXYCODONE HCL 5 MG/5ML PO SOLN: 5.0000 mg | Freq: Once | ORAL | Status: AC | PRN

## 2014-07-13 MED ORDER — OXYCODONE-ACETAMINOPHEN 5-325 MG PO TABS
1.0000 | ORAL_TABLET | ORAL | Status: DC | PRN
  Administered 2014-07-13 – 2014-07-17 (×18): 2 via ORAL
  Filled 2014-07-13 (×18): qty 2

## 2014-07-13 SURGICAL SUPPLY — 80 items
ADH SKN CLS APL DERMABOND .7 (GAUZE/BANDAGES/DRESSINGS) ×1
APL SKNCLS STERI-STRIP NONHPOA (GAUZE/BANDAGES/DRESSINGS) ×1
BAG DECANTER FOR FLEXI CONT (MISCELLANEOUS) ×3 IMPLANT
BENZOIN TINCTURE PRP APPL 2/3 (GAUZE/BANDAGES/DRESSINGS) ×3 IMPLANT
BLADE 10 SAFETY STRL DISP (BLADE) ×1 IMPLANT
BLADE SURG 11 STRL SS (BLADE) ×3 IMPLANT
BLADE SURG ROTATE 9660 (MISCELLANEOUS) IMPLANT
BRUSH SCRUB EZ PLAIN DRY (MISCELLANEOUS) ×3 IMPLANT
BUR MATCHSTICK NEURO 3.0 LAGG (BURR) ×3 IMPLANT
BUR PRECISION FLUTE 6.0 (BURR) ×3 IMPLANT
CANISTER SUCT 3000ML (MISCELLANEOUS) ×3 IMPLANT
CAP LOCKING (Cap) ×12 IMPLANT
CAP LOCKING 5.5 CREO (Cap) IMPLANT
CLOSURE WOUND 1/2 X4 (GAUZE/BANDAGES/DRESSINGS) ×1
CONT SPEC 4OZ CLIKSEAL STRL BL (MISCELLANEOUS) ×6 IMPLANT
COVER BACK TABLE 24X17X13 BIG (DRAPES) IMPLANT
COVER TABLE BACK 60X90 (DRAPES) ×3 IMPLANT
DECANTER SPIKE VIAL GLASS SM (MISCELLANEOUS) ×3 IMPLANT
DERMABOND ADVANCED (GAUZE/BANDAGES/DRESSINGS) ×2
DERMABOND ADVANCED .7 DNX12 (GAUZE/BANDAGES/DRESSINGS) ×1 IMPLANT
DRAPE C-ARM 42X72 X-RAY (DRAPES) ×6 IMPLANT
DRAPE LAPAROTOMY 100X72X124 (DRAPES) ×3 IMPLANT
DRAPE POUCH INSTRU U-SHP 10X18 (DRAPES) ×3 IMPLANT
DRAPE PROXIMA HALF (DRAPES) IMPLANT
DRAPE SURG 17X23 STRL (DRAPES) ×3 IMPLANT
DRILL 6.0/5.0 (BIT) ×2 IMPLANT
DRSG OPSITE 4X5.5 SM (GAUZE/BANDAGES/DRESSINGS) ×6 IMPLANT
DRSG OPSITE POSTOP 4X8 (GAUZE/BANDAGES/DRESSINGS) ×2 IMPLANT
DURAPREP 26ML APPLICATOR (WOUND CARE) ×3 IMPLANT
ELECT REM PT RETURN 9FT ADLT (ELECTROSURGICAL) ×3
ELECTRODE REM PT RTRN 9FT ADLT (ELECTROSURGICAL) ×1 IMPLANT
EVACUATOR 3/16  PVC DRAIN (DRAIN) ×2
EVACUATOR 3/16 PVC DRAIN (DRAIN) ×1 IMPLANT
GAUZE SPONGE 4X4 16PLY XRAY LF (GAUZE/BANDAGES/DRESSINGS) IMPLANT
GLOVE BIO SURGEON STRL SZ8 (GLOVE) ×6 IMPLANT
GLOVE BIOGEL PI IND STRL 7.0 (GLOVE) IMPLANT
GLOVE BIOGEL PI INDICATOR 7.0 (GLOVE) ×2
GLOVE ECLIPSE 6.5 STRL STRAW (GLOVE) ×2 IMPLANT
GLOVE EXAM NITRILE LRG STRL (GLOVE) IMPLANT
GLOVE EXAM NITRILE MD LF STRL (GLOVE) IMPLANT
GLOVE EXAM NITRILE XL STR (GLOVE) IMPLANT
GLOVE EXAM NITRILE XS STR PU (GLOVE) IMPLANT
GLOVE INDICATOR 8.5 STRL (GLOVE) ×6 IMPLANT
GLOVE OPTIFIT SS 6.5 STRL BRWN (GLOVE) ×8 IMPLANT
GOWN STRL REUS W/ TWL LRG LVL3 (GOWN DISPOSABLE) IMPLANT
GOWN STRL REUS W/ TWL XL LVL3 (GOWN DISPOSABLE) ×2 IMPLANT
GOWN STRL REUS W/TWL 2XL LVL3 (GOWN DISPOSABLE) ×2 IMPLANT
GOWN STRL REUS W/TWL LRG LVL3 (GOWN DISPOSABLE) ×6
GOWN STRL REUS W/TWL XL LVL3 (GOWN DISPOSABLE) ×6
IMPL SPINE MCS 6.5 5.5X35 (Neuro Prosthesis/Implant) IMPLANT
IMPL SPINE RISE 8-15-10-26M (Neuro Prosthesis/Implant) IMPLANT
IMPL SPINE RISE 8-15-10-30M (Neuro Prosthesis/Implant) IMPLANT
IMPLANT SPINE MCS 6.5 5.5X35 (Neuro Prosthesis/Implant) ×6 IMPLANT
IMPLANT SPINE RISE 8-15-10-26M (Neuro Prosthesis/Implant) ×6 IMPLANT
IMPLANT SPINE RISE 8-15-10-30M (Neuro Prosthesis/Implant) ×6 IMPLANT
KIT BASIN OR (CUSTOM PROCEDURE TRAY) ×3 IMPLANT
KIT ROOM TURNOVER OR (KITS) ×3 IMPLANT
NDL HYPO 25X1 1.5 SAFETY (NEEDLE) ×1 IMPLANT
NEEDLE HYPO 25X1 1.5 SAFETY (NEEDLE) ×3 IMPLANT
NS IRRIG 1000ML POUR BTL (IV SOLUTION) ×3 IMPLANT
PACK LAMINECTOMY NEURO (CUSTOM PROCEDURE TRAY) ×3 IMPLANT
PAD ARMBOARD 7.5X6 YLW CONV (MISCELLANEOUS) ×9 IMPLANT
PATTIES SURGICAL 1X1 (DISPOSABLE) ×2 IMPLANT
PUTTY BONE DBX 5CC MIX (Putty) ×2 IMPLANT
ROD SPINE STR CREO 5.5X65 (Rod) ×4 IMPLANT
SCREW MOD 6.0-5.0X35MM (Screw) ×8 IMPLANT
SPONGE GAUZE 4X4 12PLY (GAUZE/BANDAGES/DRESSINGS) ×3 IMPLANT
SPONGE LAP 4X18 X RAY DECT (DISPOSABLE) IMPLANT
SPONGE SURGIFOAM ABS GEL 100 (HEMOSTASIS) ×3 IMPLANT
STRIP CLOSURE SKIN 1/2X4 (GAUZE/BANDAGES/DRESSINGS) ×3 IMPLANT
SUT VIC AB 0 CT1 18XCR BRD8 (SUTURE) ×1 IMPLANT
SUT VIC AB 0 CT1 8-18 (SUTURE) ×3
SUT VIC AB 2-0 CT1 18 (SUTURE) ×3 IMPLANT
SUT VICRYL 4-0 PS2 18IN ABS (SUTURE) ×3 IMPLANT
SYR 20ML ECCENTRIC (SYRINGE) ×3 IMPLANT
TOWEL OR 17X24 6PK STRL BLUE (TOWEL DISPOSABLE) ×3 IMPLANT
TOWEL OR 17X26 10 PK STRL BLUE (TOWEL DISPOSABLE) ×3 IMPLANT
TRAY FOLEY CATH 14FRSI W/METER (CATHETERS) ×3 IMPLANT
TULIP CREP AMP 5.5MM (Orthopedic Implant) ×12 IMPLANT
WATER STERILE IRR 1000ML POUR (IV SOLUTION) ×3 IMPLANT

## 2014-07-13 NOTE — Transfer of Care (Signed)
Immediate Anesthesia Transfer of Care Note  Patient: Brent Reed  Procedure(s) Performed: Procedure(s) with comments: POSTERIOR LUMBAR FUSION 2 LEVEL (N/A) - Posterior Lumbar Four-Five/Lumbar Five-Sacral One Iinterbody and Fusion with Interbody Prosthesis, Posterior Lateral Arthrodesis, and Posterior Segmental Instrumentation  Patient Location: PACU  Anesthesia Type:General  Level of Consciousness: awake, alert  and oriented  Airway & Oxygen Therapy: Patient Spontanous Breathing and Patient connected to nasal cannula oxygen  Post-op Assessment: Report given to PACU RN and Post -op Vital signs reviewed and stable  Post vital signs: Reviewed and stable  Complications: No apparent anesthesia complications

## 2014-07-13 NOTE — Anesthesia Preprocedure Evaluation (Addendum)
Anesthesia Evaluation  Patient identified by MRN, date of birth, ID band Patient awake    Reviewed: Allergy & Precautions, H&P , NPO status , Patient's Chart, lab work & pertinent test results  Airway Mallampati: I TM Distance: >3 FB Neck ROM: Full    Dental  (+) Edentulous Upper, Edentulous Lower   Pulmonary Current Smoker,          Cardiovascular     Neuro/Psych Seizures -,     GI/Hepatic GERD-  Controlled,  Endo/Other    Renal/GU      Musculoskeletal   Abdominal   Peds  Hematology   Anesthesia Other Findings   Reproductive/Obstetrics                          Anesthesia Physical Anesthesia Plan  ASA: II  Anesthesia Plan: General   Post-op Pain Management:    Induction: Intravenous  Airway Management Planned: Oral ETT  Additional Equipment:   Intra-op Plan:   Post-operative Plan: Extubation in OR  Informed Consent: I have reviewed the patients History and Physical, chart, labs and discussed the procedure including the risks, benefits and alternatives for the proposed anesthesia with the patient or authorized representative who has indicated his/her understanding and acceptance.     Plan Discussed with: CRNA and Surgeon  Anesthesia Plan Comments:         Anesthesia Quick Evaluation

## 2014-07-13 NOTE — H&P (Signed)
Brent Reed is an 38 y.o. male.   Chief Complaint: Back and right leg pain HPI: Patient is a 38 year old gentleman is a long-standing back and right leg pain undergone previous laminectomy and discectomy many years ago did very well initially however is recurrent L5 and S1 pain workup revealed recurrent disc herniation and annular tear and severe lateral recess stenosis at both L4-5 and L5-S1 due to patient's failure conservative treatment imaging findings progression of clinical syndrome I recommended decompression stabilization procedure at L4-5 and L5-S1 patient has predominantly mechanical back pain and secondary pain going down his legs. He understands the risks of the procedure perioperative course and expectations of outcome alternatives surgery.  Past Medical History  Diagnosis Date  . Seizures     last 2-08  from medication  . H/O hiatal hernia   . GERD (gastroesophageal reflux disease)     tums as needed    Past Surgical History  Procedure Laterality Date  . 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 Appendectomy    . Back surgery      History reviewed. No pertinent family history. Social History:  reports that he has been smoking.  He does not have any smokeless tobacco history on file. He reports that he does not drink alcohol or use illicit drugs.  Allergies:  Allergies  Allergen Reactions  . Ultram [Tramadol] Other (See Comments)    seizures  . Wellbutrin [Bupropion] Other (See Comments)    seizures    Medications Prior to Admission  Medication Sig Dispense Refill  . HYDROcodone-acetaminophen (NORCO) 10-325 MG per tablet Take 1 tablet by mouth every 6 (six) hours as needed for moderate pain.         Results for orders placed during the hospital encounter of 07/13/14 (from the past 48 hour(s))  BASIC METABOLIC PANEL     Status: Abnormal   Collection Time    07/13/14  9:32 AM      Result Value Ref Range   Sodium 139  137 - 147 mEq/L   Potassium 4.0  3.7 - 5.3 mEq/L    Chloride 99  96 - 112 mEq/L   CO2 23  19 - 32 mEq/L   Glucose, Bld 91  70 - 99 mg/dL   BUN 17  6 - 23 mg/dL   Creatinine, Ser 0.91  0.50 - 1.35 mg/dL   Calcium 9.4  8.4 - 10.5 mg/dL   GFR calc non Af Amer >90  >90 mL/min   GFR calc Af Amer >90  >90 mL/min   Comment: (NOTE)     The eGFR has been calculated using the CKD EPI equation.     This calculation has not been validated in all clinical situations.     eGFR's persistently <90 mL/min signify possible Chronic Kidney     Disease.   Anion gap 17 (*) 5 - 15  CBC     Status: Abnormal   Collection Time    07/13/14  9:32 AM      Result Value Ref Range   WBC 10.8 (*) 4.0 - 10.5 K/uL   RBC 4.85  4.22 - 5.81 MIL/uL   Hemoglobin 15.6  13.0 - 17.0 g/dL   HCT 45.1  39.0 - 52.0 %   MCV 93.0  78.0 - 100.0 fL   MCH 32.2  26.0 - 34.0 pg   MCHC 34.6  30.0 - 36.0 g/dL   RDW 13.5  11.5 - 15.5 %  Platelets 282  150 - 400 K/uL  SURGICAL PCR SCREEN     Status: None   Collection Time    07/13/14  9:32 AM      Result Value Ref Range   MRSA, PCR NEGATIVE  NEGATIVE   Staphylococcus aureus NEGATIVE  NEGATIVE   Comment:            The Xpert SA Assay (FDA     approved for NASAL specimens     in patients over 75 years of age),     is one component of     a comprehensive surveillance     program.  Test performance has     been validated by [employer] for patients greater     than or equal to 70 year old.     It is not intended     to diagnose infection nor to     guide or monitor treatment.  TYPE AND SCREEN     Status: None   Collection Time    07/13/14  9:35 AM      Result Value Ref Range   ABO/RH(D) O POS     Antibody Screen NEG     Sample Expiration 07/16/2014    ABO/RH     Status: None   Collection Time    07/13/14  9:35 AM      Result Value Ref Range   ABO/RH(D) O POS     No results found.  Review of Systems  Constitutional: Negative.   HENT: Negative.   Eyes: Negative.   Respiratory: Negative.   Cardiovascular:  Negative.   Gastrointestinal: Negative.   Genitourinary: Negative.   Musculoskeletal: Positive for back pain.  Skin: Negative.   Neurological: Positive for tingling and sensory change.  Psychiatric/Behavioral: Negative.     Blood pressure 153/81, pulse 88, temperature 98.1 F (36.7 C), temperature source Oral, resp. rate 17, height _0  (1.778 m), weight 82.555 kg (182 lb), SpO2 98.00%. Physical Exam  Constitutional: He is oriented to person, place, and time. He appears well-nourished.  HENT:  Head: Normocephalic.  Eyes: Pupils are equal, round, and reactive to light.  Neck: Normal range of motion.  Respiratory: Effort normal.  GI: Soft.  Neurological: He is alert and oriented to person, place, and time. He has normal strength. GCS eye subscore is 4. GCS verbal subscore is 5. GCS motor subscore is 6.  Strength is 5 out of 5 in his iliopsoas, quads, hamstrings, gastrocs, anterior tibialis, and EHL.  Skin: Skin is warm and dry.     Assessment/Plan 38 year old on presents for an L4-5 and L5-S1 decompression fusion the  Kenslie Abbruzzese P 07/13/2014, 12:21 PM

## 2014-07-13 NOTE — Op Note (Signed)
Preoperative diagnosis: Lumbar spondylosis with stenosis L5-S1 with degenerative disc disease recurrent disc herniation L4-5 on the right with lumbar spinal stenosis lateral recess stenosis and severe foraminal stenosis of the L4, L5, and S1 nerve roots and degenerative disc disease at L4-5  Postoperative diagnosis: Same  Procedure: #1 decompressive lumbar laminectomy L5-S1 with complete medial facetectomies radical foraminotomies of the L5 and S1 nerve roots and redo decompressive lumbar limiting L4-5 with complete medial facetectomies radical foraminotomies of the L4 nerve roots  #2 posterior lumbar interbody fusion L4-5 L5-S1 using the globus arise expandable peek cage system back with local autograft mixed with DBX mixed  #3 cortical screw fixation L4-S1 using the globus Creo MCS cortical screw system  #4 placement of a large Hemovac drain  Surgeon: Brent Reed  Assistant: Brent Reed  Anesthesia: Gen.  EBL: Minimal  History of present illness: Patient is a very pleasant 38 year old gentleman who previously undergone an L4-5 discectomy who presented with recurrent back and right leg pain rating down L5 and S1 nerve root pattern workup revealed severe foraminal stenosis and the L5 and S1 nerve roots coming off at L4-5 and L5-S1. Patient with epidural steroid injections physical therapy with no significant relief patient imaging showed progressive deterioration degeneration with motor changes in the endplates severe lumbar spinal stenosis and so I recommended decompression stabilization procedure at L4-5 and L5-S1 I extensively went over the risks and benefits of the operation the patient as well as perioperative course expectations of outcome and alternatives surgery and he understood and agreed to proceed forward.  Operative procedure: Patient brought in the or was induced under general anesthesia positioned prone on the Wilson frame his back was prepped and draped in routine sterile fashion.  Incision was drawn out extending his old incision a little bit cephalad and caudal to the S1 spinous process and after infiltration 10 cc lidocaine with epi a midline incision was made and scar tissues dissected free and subperiosteal dissections care lamina of L4-L5 and S1 bilaterally. Self-retaining retractor was placed and attention was first taken to cortical screw placement at L4 to using AP and lateral fluoroscopy pilot holes were drilled on the medial aspect of the superior aspect the pars under AP fluoroscopy to 5 and 7:00 position of the L4 pedicle these were then drilled to approximately 35 mm probed tapped probed again and 60/50 by 35 mm screws were placed at L4. Fluoroscopy confirmed good position of screws in both AP and lateral views. Then the spinous process of L4 and L5 was removed central decompression was begun working first on the patient's left side the decompression was completed the margins of the scar tissue on the right complete medial facetectomies were performed at L4-5 L5-S1 and aggressive abutting both endplates a correction aggressive abutting both the super to cutting processes at L4-5 and S1 allowed access lateral margins disc space and also decompressed the undersurface of the L4 and L5 nerve roots. The L4 and L5 nerve roots under marked amount of stenosis from facet arthropathy this is all teased off of the nerve roots and removed in piecemeal fashion. After the left side was decompressed a similar fashion this taken the interbody work . Epidural veins are coagulated disc spaces were incised bilaterally retractors were put in the contralateral side endplates were prepared prepared with school's curettes scrapers and shavers with moving the distal suture rongeurs after adequate endplate preparation been achieved the rise cages were sized selected and packed with local autograft mixed DBX then first  working at L5-S1 this was completed we 26 mm length 10 mm width 8-15 mm cages with a  10 lordosis. Aggressive packing centrally with autograft mixed with DBX was carried out. Then L4-5 recurrent disc is noted underneath the right L4 nerve root as well as the proximal right L5 nerve root this is all teased off the spaces were prepared in a similar fashion the contralateral distractor in Pl. at 30 mm 8 x 14 10 cages were inserted at L4-5 with aggressive packing of local are graft mixed DBX centrally. After only a by Karie Chimeraworkman Donna with foraminal reinspected confirm no migration of graft material to confirm patency. Intense taken place of the screws at L5 and S1 again using a medial entry point projecting straight and slightly lateral using fluoroscopy these holes were drilled tapped cannulated probed and then 60/50 35 mm screws were placed at L5 however 65/55 screws placed at S1. Then 2 rods were selected top and that's tightened down all the foraminal reinspected bellies nuts were torqued down just was laid up the dura large ureter was placed and was closed in layers with after Vicryl and the skin was closed with a running 4 subcuticular and Steri-Strips were applied patient recovered in stable condition. At the end of case all needle counts and sponge counts were correct.

## 2014-07-14 ENCOUNTER — Encounter (HOSPITAL_COMMUNITY): Payer: Self-pay

## 2014-07-14 MED ORDER — MANAGING BACK PAIN BOOK
Freq: Once | Status: AC
  Administered 2014-07-14: 05:00:00
  Filled 2014-07-14: qty 1

## 2014-07-14 NOTE — Evaluation (Signed)
Occupational Therapy Evaluation Patient Details Name: Neldon Shepard MRN: [id number] DOB: [birth year] Today's Date: 07/14/2014    History of Present Illness pt is a 38 y.o. male s/p PLIF L4-S1 on 07/13/14.   Clinical Impression   Pt demonstrates decline in function and safety with ADLs and ADL mobility and would benefit from acute OT services to address impairments to increase level of function and safety    Follow Up Recommendations  No OT follow up;Supervision/Assistance - 24 hour    Equipment Recommendations   ADL A/E   Recommendations for Other Services       Precautions / Restrictions Precautions Precautions: Back Precaution Comments: pt able recall back precautions Restrictions Weight Bearing Restrictions: No      Mobility Bed Mobility Overal bed mobility: Needs Assistance Bed Mobility: Rolling;Sidelying to Sit;Sit to Sidelying Rolling: Supervision Sidelying to sit: Supervision       General bed mobility comments: HOB flattened to simulate home; cues for log rolling technique   Transfers Overall transfer level: Needs assistance Equipment used: None Transfers: Sit to/from Stand Sit to Stand: Min guard         General transfer comment: min guard to steady with transfers and cues for hand placement and sequencing with transfers     Balance Overall balance assessment: No apparent balance deficits (not formally assessed)                                          ADL Overall ADL's : Needs assistance/impaired     Grooming: Wash/dry hands;Wash/dry face;Standing;Min guard   Upper Body Bathing: Set up;Sitting   Lower Body Bathing: Moderate assistance   Upper Body Dressing : Set up;Sitting   Lower Body Dressing: Moderate assistance   Toilet Transfer: Hydrographic surveyor Details (indicate cue type and reason): min guard to steady with transfers and cues for hand placement and sequencing with transfers  Toileting- Designer, fashion/clothing and Hygiene: Min guard   Tub/ Shower Transfer: Min guard;Ambulation;Grab bars   Functional mobility during ADLs: Min guard General ADL Comments: Pt provided with education and demo of ADL A/E for home use     Vision  no change form baseline                   Perception Perception Perception Tested?: No   Praxis Praxis Praxis tested?: Not tested    Pertinent Vitals/Pain 7/10 back pain reported, VSS     Hand Dominance Left   Extremity/Trunk Assessment Upper Extremity Assessment Upper Extremity Assessment: Overall WFL for tasks assessed   Lower Extremity Assessment Lower Extremity Assessment: Defer to PT evaluation   Cervical / Trunk Assessment Cervical / Trunk Assessment: Normal   Communication Communication Communication: No difficulties   Cognition Arousal/Alertness: Awake/alert Behavior During Therapy: WFL for tasks assessed/performed Overall Cognitive Status: Within Functional Limits for tasks assessed                     General Comments   pt pleasant and cooperative                 Home Living Family/patient expects to be discharged to:: Private residence Living Arrangements: Parent Available Help at Discharge: Family;Available 24 hours/day Type of Home: House Home Access: Stairs to enter [employer] of Steps: 2 Entrance Stairs-Rails: None Home Layout: One level     Bathroom Shower/Tub: Walk-in shower  Bathroom Toilet: Handicapped height     Home Equipment: None          Prior Functioning/Environment Level of Independence: Independent             OT Diagnosis: Acute pain   OT Problem List: Decreased knowledge of use of DME or AE;Pain;Decreased activity tolerance   OT Treatment/Interventions: Self-care/ADL training;Therapeutic activities;DME and/or AE instruction;Patient/family education;Neuromuscular education    OT Goals(Current goals can be found in the care plan section) Acute Rehab OT  Goals Patient Stated Goal: to go home with mom soon OT Goal Formulation: With patient Time For Goal Achievement: 07/21/14 Potential to Achieve Goals: Good ADL Goals Pt Will Perform Grooming: with supervision;with set-up;standing Pt Will Perform Lower Body Bathing: with min assist;with caregiver independent in assisting;with adaptive equipment Pt Will Perform Lower Body Dressing: with min assist;with caregiver independent in assisting;with adaptive equipment Pt Will Transfer to Toilet: with supervision;with modified independence;ambulating;grab bars;regular height toilet Pt Will Perform Toileting - Clothing Manipulation and hygiene: with supervision;with modified independence;sit to/from stand Pt Will Perform Tub/Shower Transfer: with supervision;ambulating  OT Frequency: Min 2X/week   Barriers to D/C:    none                     End of Session    Activity Tolerance: Patient tolerated treatment well Patient left: in bed   Time: 1021-1039 OT Time Calculation (min): 18 min Charges:  OT General Charges $OT Visit: 1 Procedure OT Evaluation $Initial OT Evaluation Tier I: 1 Procedure OT Treatments $Therapeutic Activity: 8-22 mins G-Codes:    Galen ManilaSpencer, Mikaela Hilgeman Jeanette 07/14/2014, 12:36 PM

## 2014-07-14 NOTE — Anesthesia Postprocedure Evaluation (Signed)
Anesthesia Post Note  Patient: Brent ApleyJeremy Reed  Procedure(s) Performed: Procedure(s) (LRB): POSTERIOR LUMBAR FUSION 2 LEVEL (N/A)  Anesthesia type: general  Patient location: PACU  Post pain: Pain level controlled  Post assessment: Patient's Cardiovascular Status Stable  Last Vitals:  Filed Vitals:   07/14/14 1811  BP: 147/77  Pulse: 72  Temp: 37.7 C  Resp: 18    Post vital signs: Reviewed and stable  Level of consciousness: sedated  Complications: No apparent anesthesia complications

## 2014-07-14 NOTE — Progress Notes (Signed)
Patient ID: Brent Reed, male   DOB: 02-18-1976, 38 y.o.   MRN: [id number] Subjective: Patient reports he is doing well. He has appropriate back soreness. No role leg pain or numbness tingling or weakness. He has been out of bed.  Objective: Vital signs in last 24 hours: Temp:  [98 F (36.7 C)-99.3 F (37.4 C)] 99.3 F (37.4 C) (07/18 0926) Pulse Rate:  [58-85] 71 (07/18 0926) Resp:  [12-23] 20 (07/18 0926) BP: (119-157)/(65-90) 137/78 mmHg (07/18 0926) SpO2:  [99 %-100 %] 99 % (07/18 0926) Weight:  [82.555 kg (182 lb)] 82.555 kg (182 lb) (07/17 2125)  Intake/Output from previous day: 07/17 0701 - 07/18 0700 In: 2870 [P.O.:120; I.V.:2750] Out: 2815 [Urine:2375; Drains:190; Blood:250] Intake/Output this shift: Total I/O In: 240 [P.O.:240] Out: -   Neurologic: Grossly normal  Lab Results: Lab Results  Component Value Date   WBC 10.8* 07/13/2014   HGB 15.6 07/13/2014   HCT 45.1 07/13/2014   MCV 93.0 07/13/2014   PLT 282 07/13/2014   No results found for this basename: INR, PROTIME   BMET Lab Results  Component Value Date   NA 139 07/13/2014   K 4.0 07/13/2014   CL 99 07/13/2014   CO2 23 07/13/2014   GLUCOSE 91 07/13/2014   BUN 17 07/13/2014   CREATININE 0.91 07/13/2014   CALCIUM 9.4 07/13/2014    Studies/Results: Dg Lumbar Spine 2-3 Views  07/13/2014   CLINICAL DATA:  [age] year old male undergoing lumbar spine surgery. Initial encounter.  EXAM: DG C-ARM 61-120 MIN; LUMBAR SPINE - 2-3 VIEW  TECHNIQUE: 2 intraoperative fluoroscopic views of the lower lumbar spine.  CONTRAST:  None.  FLUOROSCOPY TIME:  1 min 56 seconds.  COMPARISON:  None.  FINDINGS: Assuming normal lumbar segmentation, transpedicular and interbody hardware has been placed at the L4-L5 and L5-S1 levels. No connecting hardware identified at this time. Sequelae of decompression of these levels also noted.  IMPRESSION: Lower lumbar fusion and decompression underway.   Electronically Signed   By: Augusto GambleLee  Hall M.D.   On:  07/13/2014 16:43   Dg C-arm 61-120 Min  07/13/2014   CLINICAL DATA:  38 year old male undergoing lumbar spine surgery. Initial encounter.  EXAM: DG C-ARM 61-120 MIN; LUMBAR SPINE - 2-3 VIEW  TECHNIQUE: 2 intraoperative fluoroscopic views of the lower lumbar spine.  CONTRAST:  None.  FLUOROSCOPY TIME:  1 min 56 seconds.  COMPARISON:  None.  FINDINGS: Assuming normal lumbar segmentation, transpedicular and interbody hardware has been placed at the L4-L5 and L5-S1 levels. No connecting hardware identified at this time. Sequelae of decompression of these levels also noted.  IMPRESSION: Lower lumbar fusion and decompression underway.   Electronically Signed   By: Augusto GambleLee  Hall M.D.   On: 07/13/2014 16:43    Assessment/Plan: Doing well postop day 1 after 2 level PLIF. PTOT and pain control. Home the next day or 2   LOS: 1 day    [name] S 07/14/2014, 1:13 PM

## 2014-07-14 NOTE — Evaluation (Signed)
Physical Therapy Evaluation Patient Details Name: Brent Reed MRN: [id number] DOB: 07/14/1976 Today's Date: 07/14/2014   History of Present Illness  pt is a 38 y.o. male s/p PLIF L4-S1 on 07/13/14.  Clinical Impression  Patient is s/p surgery listed above resulting in the deficits listed below (see PT Problem List). Patient will benefit from skilled PT to increase their independence and safety with mobility (while adhering to their precautions) to allow discharge home with mother. Pt c/o 9/10 pain that limited mobility to in room only; RN made aware.      Follow Up Recommendations No PT follow up;Supervision/Assistance - 24 hour    Equipment Recommendations  Other (comment) (TBD; at this time; no DME needed)    Recommendations for Other Services OT consult     Precautions / Restrictions Precautions Precautions: Back Precaution Comments: educated on back precautions  Restrictions Weight Bearing Restrictions: No      Mobility  Bed Mobility Overal bed mobility: Needs Assistance Bed Mobility: Rolling;Sidelying to Sit Rolling: Supervision Sidelying to sit: Supervision       General bed mobility comments: HOB flattened to simulate home; cues for log rolling technique   Transfers Overall transfer level: Needs assistance Equipment used: None Transfers: Sit to/from Stand Sit to Stand: Min guard         General transfer comment: min guard to steady with transfers and cues for hand placement and sequencing with transfers   Ambulation/Gait Ambulation/Gait assistance: Supervision Ambulation Distance (Feet): 20 Feet (10 x 2 ) Assistive device: None Gait Pattern/deviations: Decreased stride length;Shuffle;Trunk flexed Gait velocity: decr due to pain Gait velocity interpretation: Below normal speed for age/gender General Gait Details: pt reaching for external support with walking ; denies need for RW at this time; no LOB noted; pt guarded with gt due to pain   Stairs             Wheelchair Mobility    Modified Rankin (Stroke Patients Only)       Balance Overall balance assessment: No apparent balance deficits (not formally assessed)                                           Pertinent Vitals/Pain 9/10; RN aware    Home Living Family/patient expects to be discharged to:: Private residence Living Arrangements: Parent Available Help at Discharge: Family;Available 24 hours/day Type of Home: House Home Access: Stairs to enter Entrance Stairs-Rails: None Entrance Stairs-Number of Steps: 2 Home Layout: One level Home Equipment: None      Prior Function Level of Independence: Independent               Hand Dominance        Extremity/Trunk Assessment   Upper Extremity Assessment: Defer to OT evaluation           Lower Extremity Assessment: Generalized weakness (limited by pain )      Cervical / Trunk Assessment: Normal  Communication   Communication: No difficulties  Cognition Arousal/Alertness: Awake/alert Behavior During Therapy: WFL for tasks assessed/performed Overall Cognitive Status: Within Functional Limits for tasks assessed                      General Comments      Exercises        Assessment/Plan    PT Assessment Patient needs continued PT services  PT Diagnosis Difficulty walking;Acute pain;Generalized  weakness   PT Problem List Decreased strength;Decreased balance;Decreased activity tolerance;Decreased mobility;Decreased knowledge of precautions;Pain  PT Treatment Interventions DME instruction;Gait training;Stair training;Functional mobility training;Therapeutic activities;Therapeutic exercise;Balance training;Neuromuscular re-education;Patient/family education   PT Goals (Current goals can be found in the Care Plan section) Acute Rehab PT Goals Patient Stated Goal: to go home with mom soon PT Goal Formulation: With patient Time For Goal Achievement: 07/17/14 Potential  to Achieve Goals: Good    Frequency Min 5X/week   Barriers to discharge        Co-evaluation               End of Session Equipment Utilized During Treatment: Gait belt Activity Tolerance: Patient limited by pain Patient left: in chair;with call bell/phone within reach;with nursing/sitter in room Nurse Communication: Mobility status;Precautions;Patient requests pain meds         Time: 0755-0810 PT Time Calculation (min): 15 min   Charges:   PT Evaluation $Initial PT Evaluation Tier I: 1 Procedure PT Treatments $Gait Training: 8-22 mins   PT G CodesDonell Reed, Brent Reed  [phone] 07/14/2014, 9:19 AM

## 2014-07-15 MED ORDER — KETOROLAC TROMETHAMINE 15 MG/ML IJ SOLN
15.0000 mg | Freq: Four times a day (QID) | INTRAMUSCULAR | Status: AC
  Administered 2014-07-15 – 2014-07-16 (×5): 15 mg via INTRAVENOUS
  Filled 2014-07-15 (×5): qty 1

## 2014-07-15 NOTE — Progress Notes (Signed)
Removed hemovacc and dressing per MD order.  Patient tolerated well.  Will continue to monitor patient.

## 2014-07-15 NOTE — Plan of Care (Signed)
Problem: Acute Rehab PT Goals(only PT should resolve) Goal: Pt Will Go Up/Down Stairs Outcome: Not Applicable Date Met:  03/08/80 States he will not have steps at his mothers house

## 2014-07-15 NOTE — Progress Notes (Signed)
Occupational Therapy Treatment and Discharge Patient Details Name: Brent Reed MRN: [id number] DOB: 06/23/1976 Today's Date: 07/15/2014    History of present illness pt is a 38 y.o. male s/p PLIF L4-S1 on 07/13/14.   OT comments  Pt able to complete ADL tasks and functional mobility at mod I level.  Education completed. Pt has a shower chair at home from a family member that he can use as needed. No further acute OT needs. Signing off.  Follow Up Recommendations  No OT follow up;Supervision/Assistance - 24 hour    Equipment Recommendations  None recommended by OT    Recommendations for Other Services      Precautions / Restrictions Precautions Precautions: Back Precaution Booklet Issued: Yes (comment) Precaution Comments: pt able to independently recall 3/3 back precautions; demo good carryover during session  Required Braces or Orthoses: Spinal Brace Spinal Brace: Lumbar corset;Other (comment) Spinal Brace Comments: per pt request Restrictions Weight Bearing Restrictions: No       Mobility Bed Mobility Overal bed mobility: Modified Independent             General bed mobility comments: HOB flattened to simulate home; pt demo good log rolling technique; incr time due to pain   Transfers Overall transfer level: Modified independent Equipment used: Rolling walker (2 wheeled) Transfers: Sit to/from Stand Sit to Stand: Modified independent (Device/Increase time)         General transfer comment: able to transfer at mod I with use of arm rests and handrail from bed and from toilet; no LOB noted    Balance Overall balance assessment: No apparent balance deficits (not formally assessed)                                 ADL Overall ADL's : Modified independent     Grooming: Wash/dry hands;Modified independent;Standing           Upper Body Dressing : Modified independent;Standing   Lower Body Dressing: Modified independent;Sit to/from stand    Toilet Transfer: Modified Independent;Ambulation   Toileting- Clothing Manipulation and Hygiene: Modified independent;Sit to/from stand   Tub/ Banker: Walk-in shower;Modified independent;Ambulation   Functional mobility during ADLs: Modified independent;Rolling walker General ADL Comments: Pt mod I for incr time and use of RW.  Pt able to maintain back precautions throughout session while performing ADLs.  Pt reports he does not think he needs AE as he is able to cross ankles over knees today.  Pt also able to reach back peri area to simulate toileting hygiene (no need for toilet aid).       Vision                     Perception     Praxis      Cognition   Behavior During Therapy: WFL for tasks assessed/performed Overall Cognitive Status: Within Functional Limits for tasks assessed                       Extremity/Trunk Assessment               Exercises     Shoulder Instructions       General Comments      Pertinent Vitals/ Pain       See vitals  Home Living  Prior Functioning/Environment              Frequency       Progress Toward Goals  OT Goals(current goals can now be found in the care plan section)  Progress towards OT goals: Goals met/education completed, patient discharged from OT  Acute Rehab OT Goals Patient Stated Goal: to go home tomorrow  ADL Goals Pt Will Perform Grooming: with supervision;with set-up;standing Pt Will Perform Lower Body Bathing: with min assist;with caregiver independent in assisting;with adaptive equipment Pt Will Perform Lower Body Dressing: with min assist;with caregiver independent in assisting;with adaptive equipment Pt Will Transfer to Toilet: with supervision;with modified independence;ambulating;grab bars;regular height toilet Pt Will Perform Toileting - Clothing Manipulation and hygiene: with supervision;with modified  independence;sit to/from stand Pt Will Perform Tub/Shower Transfer: with supervision;ambulating  Plan Discharge plan remains appropriate;All goals met and education completed, patient discharged from OT services    Co-evaluation                 End of Session Equipment Utilized During Treatment: Rolling walker;Back brace   Activity Tolerance Patient tolerated treatment well   Patient Left in chair;with call bell/phone within reach   Nurse Communication Mobility status        Time: 3299-2426 OT Time Calculation (min): 23 min  Charges: OT General Charges $OT Visit: 1 Procedure OT Treatments $Self Care/Home Management : 23-37 mins  Darrol Jump 07/15/2014, 11:48 AM 07/15/2014 Darrol Jump OTR/L Pager [phone] Office [phone]

## 2014-07-15 NOTE — Progress Notes (Signed)
Physical Therapy Treatment Patient Details Name: Brent Reed MRN: [id number] DOB: 09-14-1976 Today's Date: 07/15/2014    History of Present Illness pt is a 38 y.o. male s/p PLIF L4-S1 on 07/13/14.    PT Comments    Pt mobilizing well with therapy. Is safe to ambulate unit as tolerated. Encouraged to ambulate unit 2-3x's per day. Plans to ambulate with RW upon acute D/C till pain subsides. All education completed. Will sign off at this time.   Follow Up Recommendations  No PT follow up;Supervision/Assistance - 24 hour     Equipment Recommendations  None recommended by PT (pt has RW at home)    Recommendations for Other Services OT consult     Precautions / Restrictions Precautions Precautions: Back Precaution Booklet Issued: Yes (comment) Precaution Comments: pt able to independently recall 3/3 back precautions; demo good carryover during session  Required Braces or Orthoses: Spinal Brace Spinal Brace: Lumbar corset;Other (comment) Spinal Brace Comments: per pt request Restrictions Weight Bearing Restrictions: No    Mobility  Bed Mobility Overal bed mobility: Modified Independent             General bed mobility comments: HOB flattened to simulate home; pt demo good log rolling technique; incr time due to pain   Transfers Overall transfer level: Needs assistance Equipment used: Rolling walker (2 wheeled) Transfers: Sit to/from Stand Sit to Stand: Modified independent (Device/Increase time)         General transfer comment: able to transfer at mod I with use of arm rests and handrail from bed and from toilet; no LOB noted  Ambulation/Gait Ambulation/Gait assistance: Supervision;Modified independent (Device/Increase time) Ambulation Distance (Feet): 190 Feet Assistive device: Rolling walker (2 wheeled) Gait Pattern/deviations: Step-through pattern;Decreased stride length Gait velocity: decr due to pain Gait velocity interpretation: Below normal speed for  age/gender General Gait Details: pt much more stable when ambulating with RW; at supervision for safety initially; progressed to mod I. encouraged pt to ambulate as tolerated; no LOB noted    Stairs            Wheelchair Mobility    Modified Rankin (Stroke Patients Only)       Balance Overall balance assessment: No apparent balance deficits (not formally assessed)                                  Cognition Arousal/Alertness: Awake/alert Behavior During Therapy: WFL for tasks assessed/performed Overall Cognitive Status: Within Functional Limits for tasks assessed                      Exercises      General Comments        Pertinent Vitals/Pain 7/10; premedicated by RN    Home Living                      Prior Function            PT Goals (current goals can now be found in the care plan section) Acute Rehab PT Goals Patient Stated Goal: to go home tomorrow  PT Goal Formulation: With patient Time For Goal Achievement: 07/17/14 Potential to Achieve Goals: Good Progress towards PT goals: Goals met/education completed, patient discharged from PT    Frequency  Min 5X/week    PT Plan Current plan remains appropriate    Co-evaluation             End  of Session Equipment Utilized During Treatment: Gait belt;Back brace Activity Tolerance: Patient tolerated treatment well Patient left: in bed;with call bell/phone within reach     Time: 0915-0928 PT Time Calculation (min): 13 min  Charges:  $Gait Training: 8-22 mins                    G Codes:      Elie Confer [place], [place]  [phone] 07/15/2014, 10:11 AM

## 2014-07-15 NOTE — Progress Notes (Signed)
Utilization review completed.  

## 2014-07-15 NOTE — Progress Notes (Signed)
Subjective: Patient reports Has had nausea and vomited today. Now he feels somewhat better back pain is significant. Though she cannot sleep. The pain is difficult to control  Objective: Vital signs in last 24 hours: Temp:  [98.6 F (37 C)-99.9 F (37.7 C)] 98.6 F (37 C) (07/19 0844) Pulse Rate:  [62-81] 76 (07/19 0844) Resp:  [18-20] 18 (07/19 0844) BP: (130-147)/(76-88) 142/83 mmHg (07/19 0844) SpO2:  [91 %-100 %] 96 % (07/19 0844)  Intake/Output from previous day: 07/18 0701 - 07/19 0700 In: 610 [P.O.:480] Out: -  Intake/Output this shift:    Dressing is dry motor function is intact in both lower extremities especially right side recent previous problems  Lab Results:  Recent Labs  07/13/14 0932  WBC 10.8*  HGB 15.6  HCT 45.1  PLT 282   BMET  Recent Labs  07/13/14 0932  NA 139  K 4.0  CL 99  CO2 23  GLUCOSE 91  BUN 17  CREATININE 0.91  CALCIUM 9.4    Studies/Results: Dg Lumbar Spine 2-3 Views  07/13/2014   CLINICAL DATA:  38 year old male undergoing lumbar spine surgery. Initial encounter.  EXAM: DG C-ARM 61-120 MIN; LUMBAR SPINE - 2-3 VIEW  TECHNIQUE: 2 intraoperative fluoroscopic views of the lower lumbar spine.  CONTRAST:  None.  FLUOROSCOPY TIME:  1 min 56 seconds.  COMPARISON:  None.  FINDINGS: Assuming normal lumbar segmentation, transpedicular and interbody hardware has been placed at the L4-L5 and L5-S1 levels. No connecting hardware identified at this time. Sequelae of decompression of these levels also noted.  IMPRESSION: Lower lumbar fusion and decompression underway.   Electronically Signed   By: Augusto GambleLee  Hall M.D.   On: 07/13/2014 16:43   Dg C-arm 61-120 Min  07/13/2014   CLINICAL DATA:  38 year old male undergoing lumbar spine surgery. Initial encounter.  EXAM: DG C-ARM 61-120 MIN; LUMBAR SPINE - 2-3 VIEW  TECHNIQUE: 2 intraoperative fluoroscopic views of the lower lumbar spine.  CONTRAST:  None.  FLUOROSCOPY TIME:  1 min 56 seconds.  COMPARISON:   None.  FINDINGS: Assuming normal lumbar segmentation, transpedicular and interbody hardware has been placed at the L4-L5 and L5-S1 levels. No connecting hardware identified at this time. Sequelae of decompression of these levels also noted.  IMPRESSION: Lower lumbar fusion and decompression underway.   Electronically Signed   By: Augusto GambleLee  Hall M.D.   On: 07/13/2014 16:43    Assessment/Plan: Nausea postop persisting. Patient is ambulatory.  LOS: 2 days  We'll remove dressing and remove drain. Have patient ambulate some more. We'll add Toradol for pain control   Brent Reed 07/15/2014, 10:44 AM

## 2014-07-16 MED ORDER — SENNOSIDES-DOCUSATE SODIUM 8.6-50 MG PO TABS
1.0000 | ORAL_TABLET | Freq: Every morning | ORAL | Status: DC
  Administered 2014-07-16 – 2014-07-17 (×2): 1 via ORAL
  Filled 2014-07-16 (×2): qty 1

## 2014-07-16 MED ORDER — POLYETHYLENE GLYCOL 3350 17 G PO PACK
17.0000 g | PACK | Freq: Every day | ORAL | Status: DC
  Administered 2014-07-16 – 2014-07-17 (×2): 17 g via ORAL
  Filled 2014-07-16 (×2): qty 1

## 2014-07-16 MED FILL — Heparin Sodium (Porcine) Inj 1000 Unit/ML: INTRAMUSCULAR | Qty: 30 | Status: AC

## 2014-07-16 MED FILL — Sodium Chloride IV Soln 0.9%: INTRAVENOUS | Qty: 1000 | Status: AC

## 2014-07-16 NOTE — Clinical Social Work Note (Signed)
CSW consulted for possible SNF placement at time of discharge. Per chart review, pt to be discharged home with no PT follow-up. Thank you for the referral.  Jocilynn Grade, MSW, LCSWA Licensed Clinical Social Worker [id number] and [id number] [phone] 

## 2014-07-16 NOTE — Progress Notes (Signed)
Patient ID: Brent ApleyJeremy Reed, male   DOB: 24-Oct-1976, 38 y.o.   MRN: [id number] Experiencing abdominal distention. Abdomen is soft bowel sounds positive. Has received full complement of laxatives. Plan discharge tomorrow if abdomen less distended bowels move

## 2014-07-17 MED ORDER — CYCLOBENZAPRINE HCL 10 MG PO TABS: 10.0000 mg | ORAL_TABLET | Freq: Three times a day (TID) | ORAL | Status: DC | PRN

## 2014-07-17 MED ORDER — OXYCODONE-ACETAMINOPHEN 5-325 MG PO TABS: 1.0000 | ORAL_TABLET | ORAL | Status: DC | PRN

## 2014-07-17 NOTE — Progress Notes (Signed)
IV removed from left forearm 18ga. Catheter intact and removed without complication. Discharge instructions provided and gone over with patient.

## 2014-07-18 ENCOUNTER — Encounter: Payer: Self-pay | Admitting: Family Medicine

## 2014-07-25 ENCOUNTER — Encounter (HOSPITAL_COMMUNITY): Payer: Self-pay | Admitting: Neurosurgery

## 2014-07-25 NOTE — OR Nursing (Signed)
OR Record addendum note: Entered record and charted implant information. Elam DutchB. Massey Ruhland, RN

## 2014-08-30 NOTE — Discharge Summary (Signed)
Physician Discharge Summary  Patient ID: Brent Reed MRN: [id number] DOB/AGE: [birth year] 37 y.o.  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  Discharge Exam: Blood pressure 149/87, pulse 67, temperature 98.9 F (37.2 C), temperature source Oral, resp. rate 18, height  (1.778 m), weight 82.555 kg (182 lb), SpO2 98.00%. motor function intact. Incision clean and dry.  Disposition: 01-Home or Self Care  Discharge Instructions   Call MD for:  redness, tenderness, or signs of infection (pain, swelling, redness, odor or green/yellow discharge around incision site)    Complete by:  As directed      Call MD for:  severe uncontrolled pain    Complete by:  As directed      Call MD for:  temperature >100.4    Complete by:  As directed      Diet - low sodium heart healthy    Complete by:  As directed      Increase activity slowly    Complete by:  As directed             Medication List         cyclobenzaprine 10 MG tablet  Commonly known as:  FLEXERIL  Take 1 tablet (10 mg total) by mouth 3 (three) times daily as needed for muscle spasms.     HYDROcodone-acetaminophen 10-325 MG per tablet  Commonly known as:  NORCO  Take 1 tablet by mouth every 6 (six) hours as needed for moderate pain.     oxyCODONE-acetaminophen 5-325 MG per tablet  Commonly known as:  PERCOCET/ROXICET  Take 1-2 tablets by mouth every 4  (four) hours as needed for moderate pain.         SignedStefani Dama 08/30/2014, 7:02 AM

## 2015-03-08 ENCOUNTER — Other Ambulatory Visit: Payer: Self-pay | Admitting: Neurosurgery

## 2015-03-08 DIAGNOSIS — M5136 Other intervertebral disc degeneration, lumbar region: Secondary | ICD-10-CM

## 2015-03-14 ENCOUNTER — Ambulatory Visit
Admission: RE | Admit: 2015-03-14 | Discharge: 2015-03-14 | Disposition: A | Discharge status: Home / Self Care | Payer: Medicaid Other | Source: Ambulatory Visit | Attending: Neurosurgery | Admitting: Neurosurgery

## 2015-03-14 DIAGNOSIS — M5136 Other intervertebral disc degeneration, lumbar region: Secondary | ICD-10-CM

## 2015-11-29 IMAGING — CT CT L SPINE W/O CM
4 of 10 series · 12 of 33 positions shown, 14 images · non-contrast
Comparison: Office radiographs 02/26/2015 and 12/13/2014. Lumbar
MRI 02/26/2014.

CLINICAL DATA: Low back and right leg pain since falling on ice 2
months ago. History of lumbar fusion eight months ago.

EXAM:
CT LUMBAR SPINE WITHOUT CONTRAST
TECHNIQUE: Multidetector CT imaging of the lumbar spine was performed without
intravenous contrast administration. Multiplanar CT image
reconstructions were also generated.

[Series 4: l spine bone · axial · 0.30mm/px · z∈[-13,+77]mm · 2 of 108 slices shown, 3 images]
[im 36/108  soft-tissue]
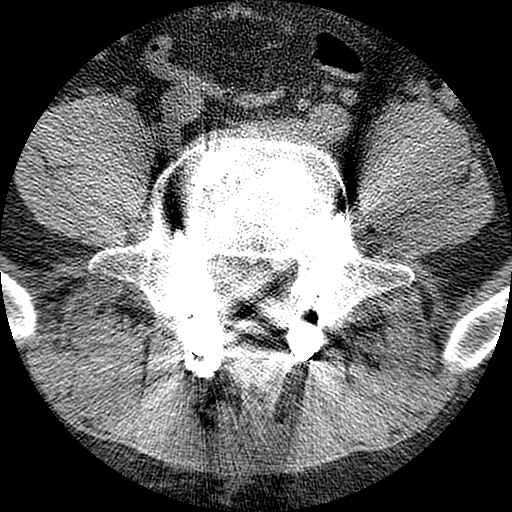
[im 36/108  bone]
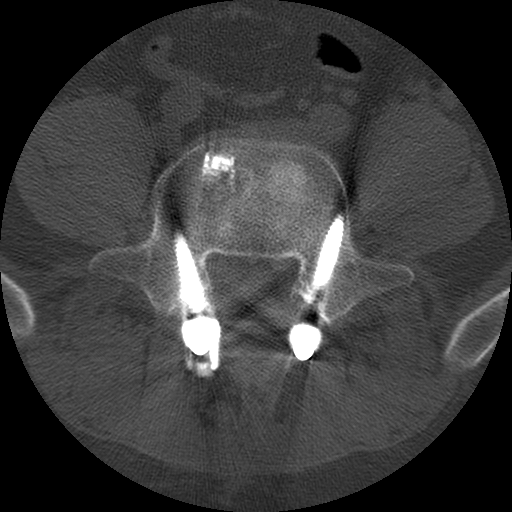
[im 72/108  bone]
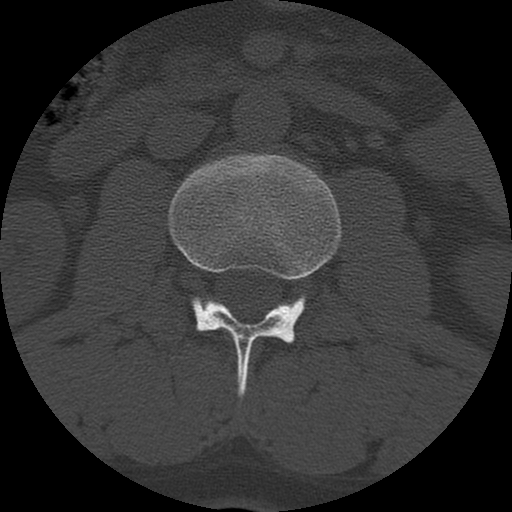

[Series 5: l spine detail · axial · 0.30mm/px · z∈[-13,+77]mm · 2 of 108 slices shown]
[im 36/108  bone]
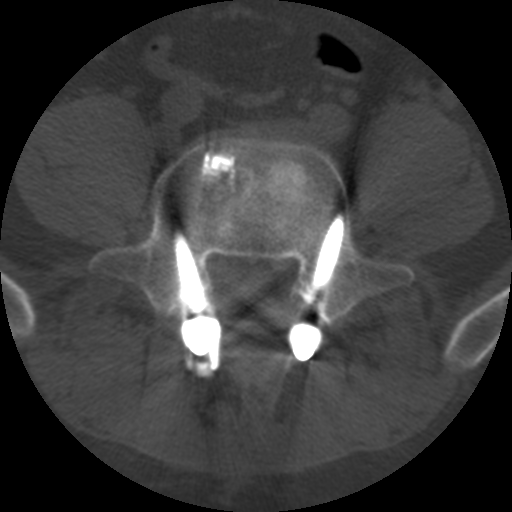
[im 72/108  bone]
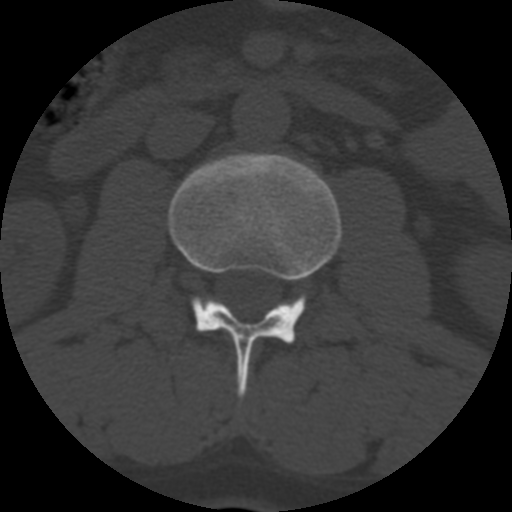

[Series 200: coronal · coronal · 0.54mm/px · 3 of 50 slices shown]
[im 10/50  bone]
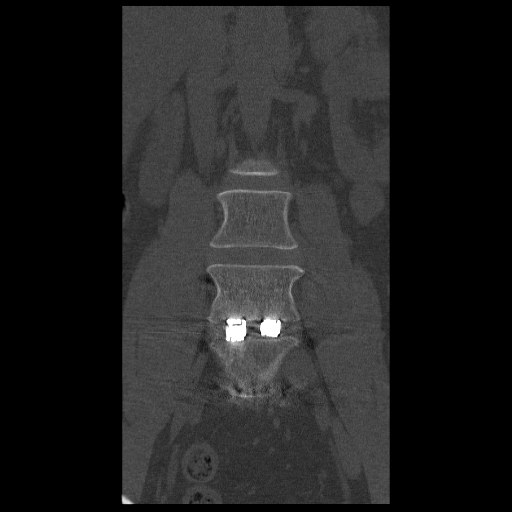
[im 20/50  bone]
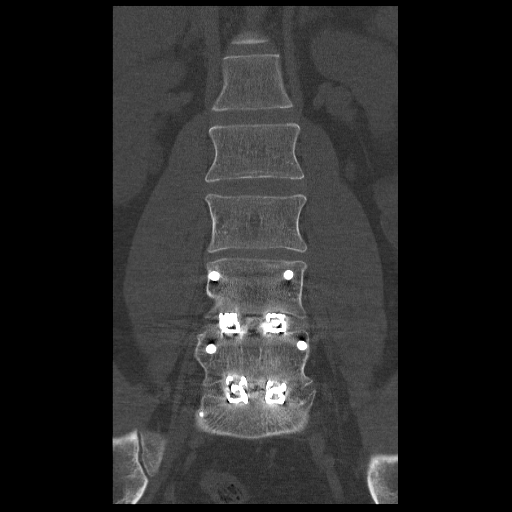
[im 30/50  bone]
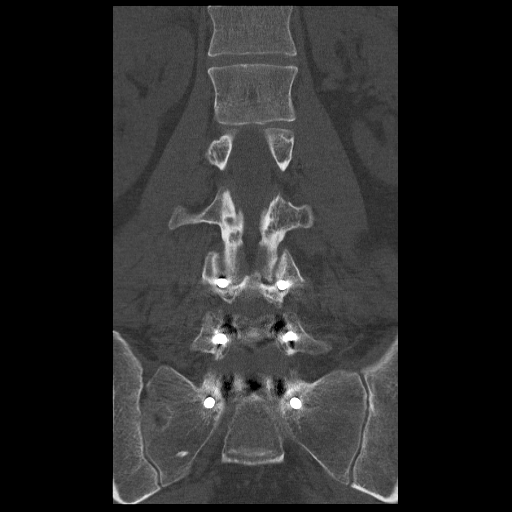

[Series 201: sagittal · sagittal · 0.54mm/px · 5 of 50 slices shown, 6 images]
[im 17/50  bone]
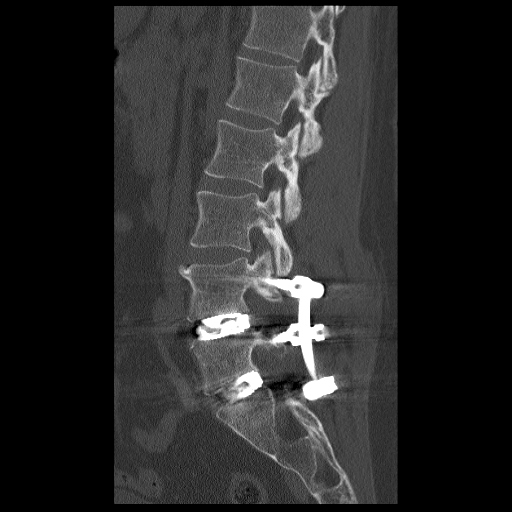
[im 21/50  bone]
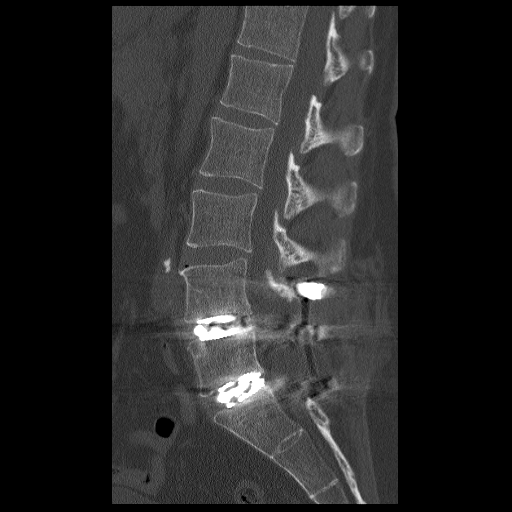
[im 25/50  soft-tissue]
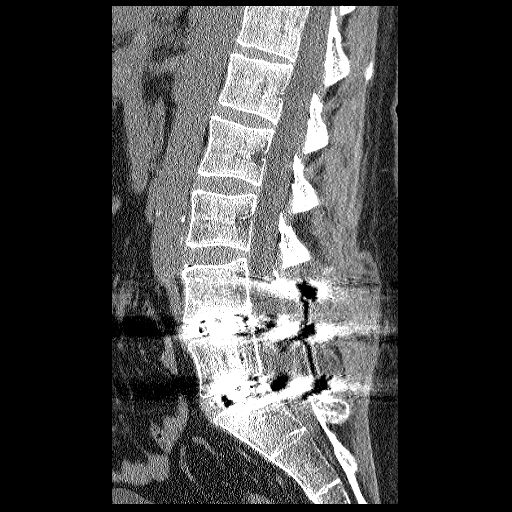
[im 25/50  bone]
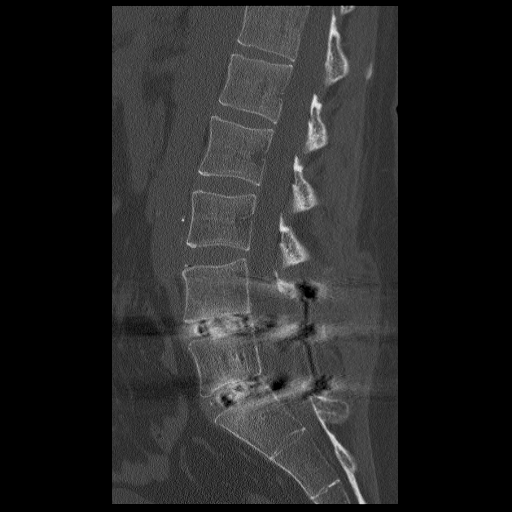
[im 29/50  bone]
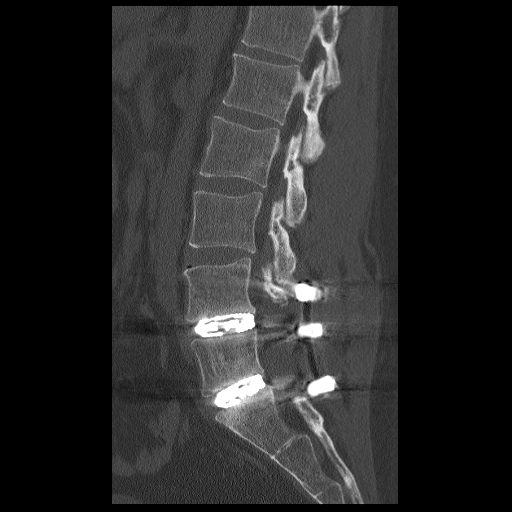
[im 33/50  bone]
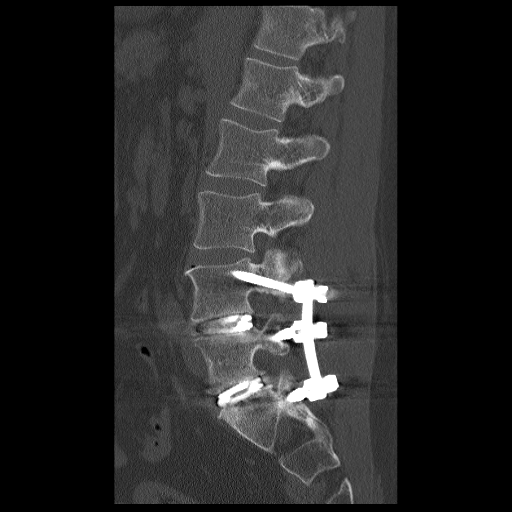

[12 of 33 positions shown; findings below may reference images not displayed]

FINDINGS: Status post laminectomy and PLIF from L4 through S1. The hardware is
intact and appears well positioned without loosening. There is solid
interbody fusion at both levels. The alignment is normal. There is
no evidence of acute fracture.

No acute paraspinal abnormalities identified. Mild aortoiliac
atherosclerosis and hepatic steatosis noted.

There are no significant disc space findings from T12-L1 through
L2-3.

L3-4: Mild disc bulging and ligamentum flavum thickening. No spinal
stenosis or nerve root encroachment.

L4-5: Status post laminectomy and PLIF. No osseous foraminal or
canal stenosis. There is possible epidural fibrosis extending
asymmetrically into the right foramen.

L5-S1: Status post laminectomy and PLIF. No osseous foraminal or
canal stenosis.

The visualized upper sacrum and sacroiliac joints appear
unremarkable.
IMPRESSION: 1. Solid interbody fusion status post L4-5 and L5-S1 laminectomy and
PLIF. The spinal canal and foramina appear well-decompressed at both
levels.
2. Minimal disc bulging and ligamentum flavum thickening at L3-4. No
spinal stenosis or nerve root encroachment.

## 2016-10-23 ENCOUNTER — Encounter: Payer: Self-pay | Admitting: Diagnostic Neuroimaging

## 2016-10-23 ENCOUNTER — Ambulatory Visit (INDEPENDENT_AMBULATORY_CARE_PROVIDER_SITE_OTHER): Payer: Medicaid Other | Admitting: Diagnostic Neuroimaging

## 2016-10-23 VITALS — BP 132/86 | HR 79 | Ht 71.0 in | Wt 186.4 lb

## 2016-10-23 DIAGNOSIS — M5416 Radiculopathy, lumbar region: Secondary | ICD-10-CM | POA: Diagnosis not present
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
## 2016-12-24 ENCOUNTER — Emergency Department (HOSPITAL_COMMUNITY)
Admission: EM | Admit: 2016-12-24 | Discharge: 2016-12-24 | Disposition: A | Discharge status: Home / Self Care | Payer: Medicaid Other | Attending: Emergency Medicine | Admitting: Emergency Medicine

## 2016-12-24 ENCOUNTER — Encounter (HOSPITAL_COMMUNITY): Payer: Self-pay | Admitting: Emergency Medicine

## 2016-12-24 DIAGNOSIS — F17210 Nicotine dependence, cigarettes, uncomplicated: Secondary | ICD-10-CM | POA: Diagnosis not present

## 2016-12-24 DIAGNOSIS — M545 Low back pain: Secondary | ICD-10-CM | POA: Diagnosis present

## 2016-12-24 DIAGNOSIS — M549 Dorsalgia, unspecified: Secondary | ICD-10-CM

## 2016-12-24 DIAGNOSIS — I10 Essential (primary) hypertension: Secondary | ICD-10-CM | POA: Diagnosis not present

## 2016-12-24 MED ORDER — IBUPROFEN 600 MG PO TABS: 600.0000 mg | ORAL_TABLET | Freq: Four times a day (QID) | ORAL | 0 refills | Status: DC | PRN

## 2016-12-24 MED ORDER — HYDROCODONE-ACETAMINOPHEN 5-325 MG PO TABS: 1.0000 | ORAL_TABLET | ORAL | 0 refills | Status: DC | PRN

## 2016-12-24 MED ORDER — METHOCARBAMOL 750 MG PO TABS: 750.0000 mg | ORAL_TABLET | Freq: Three times a day (TID) | ORAL | 0 refills | Status: DC

## 2016-12-24 NOTE — ED Triage Notes (Signed)
Pt reports history of back fusion in 2015 and for the last month pain has increasely gotten worse. Pt ambulatory in triage denies any numbness or tingling.

## 2016-12-24 NOTE — ED Provider Notes (Signed)
[facility]-EMERGENCY DEPT Provider Note   CSN: [id number] Arrival date & time: 12/24/16  1227    By signing my name below, I, Valentino SaxonBianca Contreras, attest that this documentation has been prepared under the direction and in the presence of Donivan Scullobyn Charles Niese, NP. Electronically Signed: Valentino SaxonBianca Contreras, ED Scribe. 12/24/16. 1:44 PM.  History   Chief Complaint Chief Complaint  Patient presents with  . Back Pain  . Foot Pain   The history is provided by the patient. No language interpreter was used.  HPI Comments: Brent Reed is a 40 y.o. male with PMHx of htn, seizures (not on any medications), gerd, and back pain who presents to ED for L lower back pain, onset 3 days ago, describes as spasming, intermittent, worse with bending and twisting, with intermittent sharp shooting pains down posterior L leg. He states taking OTC medication (advil and tylenol) with minimal relief. Pt denies recent trauma, injury, heavy lifting, falls, or additional known injury.  Pt states the pain today is similar to the pain he experienced with his back fusion in 2015. Notes his back pain in 2015 radiated to his right leg but has now moved to left leg. Called his neurosurgeon (Dr. Wynetta Emeryram) today and unable to get appt until next Tuesday, referred to ED for symptomatic tx. Denies fevers, chills, unexplained weight loss, dizziness, vision or gait changes, Cp, SOB, cough, abd pain, n/v/d, dysuria, hematuria, bladder or bowel dysfunction, saddle anesthesia, extremity weakness, rash, or any additional concerns.   Past Medical History:  Diagnosis Date  . Back pain   . Chronic pain    back pain  . GERD (gastroesophageal reflux disease)    tums as needed  . H/O hiatal hernia   . Hypertension    10/23/16 denies now  . 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 Seizures (HCC)    previously on tegretol, thought to be secondary to Ultram on one occurance  . Seizures (HCC)    last 2-08  from medication    Patient  Active Problem List   Diagnosis Date Noted  . HNP (herniated nucleus pulposus), lumbar 07/13/2014  . Allergic rhinitis 07/19/2011  . Knee pain, right 04/15/2011  . Hypertension 04/14/2011  . Chronic pain syndrome 01/13/2011  . CANNABIS ABUSE 05/29/2008  . TOBACCO ABUSE 01/18/2008  . UNSPECIFIED EPISODIC MOOD DISORDER 01/09/2008  . SEIZURE DISORDER 04/21/2007  . BACK PAIN, LUMBAR, WITH RADICULOPATHY 03/22/2007    Past Surgical History:  Procedure Laterality Date  . APPENDECTOMY      as teen  . BACK SURGERY  05/15/2003, 07/13/2014   x 2   . pyloric stenosis     546 weeks old       Home Medications    Prior to Admission medications   Medication Sig Start Date End Date Taking? Authorizing Provider  albuterol (PROVENTIL HFA;VENTOLIN HFA) 108 (90 Base) MCG/ACT inhaler Inhale into the lungs as needed. 02/17/13   Historical Provider, MD    Family History No family history on file.  Social History Social History  Substance Use Topics  . Smoking status: Current Every Day Smoker    Packs/day: 1.00    Years: 19.00    Types: E-cigarettes, Cigarettes  . Smokeless tobacco: Never Used  . Alcohol use No     Allergies   Ultram [tramadol]; Wellbutrin [bupropion]; Gabapentin; Bupropion hcl; Sulfamethoxazole-trimethoprim; and Tramadol hcl   Review of Systems Review of Systems  Constitutional: Negative for chills, fever and unexpected weight change.  Respiratory:  Negative for cough and shortness of breath.   Cardiovascular: Negative for chest pain, palpitations and leg swelling.  Gastrointestinal: Negative for abdominal pain, diarrhea, nausea and vomiting.  Genitourinary: Negative for dysuria, frequency, hematuria and testicular pain.  Musculoskeletal: Positive for back pain. Negative for neck pain and neck stiffness.  Skin: Negative for rash.  Neurological: Negative for dizziness, weakness and headaches.  All other systems reviewed and are negative.    Physical Exam Updated  Vital Signs BP 132/75 (BP Location: Right Arm)   Pulse 80   Temp 98.1 F (36.7 C) (Oral)   Resp 15   Ht 5\' 8"  (1.727 m)   Wt 186 lb (84.4 kg)   SpO2 99%   BMI 28.28 kg/m   Physical Exam  Constitutional: He is oriented to person, place, and time. He appears well-developed and well-nourished.  HENT:  Head: Normocephalic and atraumatic.  Eyes: Conjunctivae and EOM are normal. Pupils are equal, round, and reactive to light.  Neck: Normal range of motion. Neck supple.  Cardiovascular: Normal rate, regular rhythm, normal heart sounds and intact distal pulses.  Exam reveals no friction rub.   No murmur heard. Pulmonary/Chest: Effort normal and breath sounds normal.  Abdominal: Soft. Bowel sounds are normal. There is no tenderness.  Musculoskeletal: Normal range of motion.  +L lumbosacral paraspinous muscles ttp, no midline tenderness, no stepoff; negative SLR bilaterally; patellar and achilles reflexes 2+, sharp and dull sensation grossly intact to bilateral Le, dorsalis pedis and posterior tibial 2+, cap refill < 2sec. No CVAT  Neurological: He is alert and oriented to person, place, and time. He has normal strength. No cranial nerve deficit or sensory deficit. GCS eye subscore is 4. GCS verbal subscore is 5. GCS motor subscore is 6.  Reflex Scores:      Patellar reflexes are 2+ on the right side and 2+ on the left side.      Achilles reflexes are 2+ on the right side and 2+ on the left side. Skin: Skin is warm and dry.  Psychiatric: He has a normal mood and affect. His behavior is normal.  Nursing note and vitals reviewed.    ED Treatments / Results   DIAGNOSTIC STUDIES: Oxygen Saturation is 99% on RA, normal by my interpretation.    COORDINATION OF CARE: 1:44 PM Discussed treatment plan with pt at bedside and pt agreed to plan.   Labs (all labs ordered are listed, but only abnormal results are displayed) Labs Reviewed - No data to display  EKG  EKG Interpretation None         Radiology No results found.  Procedures Procedures (including critical care time)  Medications Ordered in ED Medications - No data to display   Initial Impression / Assessment and Plan / ED Course  I have reviewed the triage vital signs and the nursing notes.  Pertinent labs & imaging results that were available during my care of the patient were reviewed by me and considered in my medical decision making (see chart for details).  Clinical Course    Pt is a 40 y/o M who presents to ED for L lower back pain, concern for muscle spasm. No acute neuro deficits, no red flag signs. Doubt cauda equina, epidural abscess, hematoma, or acute spinal cord injury at this time. Ambulatory around pod with steady gait. Will plan to dc with symptomatic tx and neurosurg (Dr Wynetta Emeryram) follow up. Reviewed [place] DEA database-- no recent rx in  past month. Discussed discharge instructions, rx and  safety, return precautions and follow up; pt verbalizes understanding and agrees with plan, denies any additional concerns.  Final Clinical Impressions(s) / ED Diagnoses   Final diagnoses:  None    New Prescriptions New Prescriptions   No medications on file   I personally performed the services described in this documentation, which was scribed in my presence. The recorded information has been reviewed and is accurate.    Albesa Seen, NP 12/24/16 1721    Albesa Seen, NP 12/24/16 1734    Eber Hong, MD 12/24/16 1950

## 2016-12-24 NOTE — Discharge Instructions (Signed)
Take Ibuprofen as prescribed for mild to moderate pain--take with food to prevent GI upset. Take Robaxin as prescribed for muscle spasm--caution may cause sedation--do not drink alcohol, drive, or operate machinery while taking. Take Vicodin as prescribed for severe pain--caution may cause sedation--do not drink alcohol, drive, or operate machinery while taking. Call to schedule a follow up appointment with your primary care doctor. Also continue to follow up with Dr. Wynetta Emeryram as previously scheduled. Return to ER if you experience fevers, chills, unexplained weight loss, dizziness, vision or gait changes, chest pain, shortness of breath, abdominal pain, nausea/vomiting/diarrhea, loss of bladder or bowel control, numbness to groin, extremity weakness, worsening symptoms, or any additional concerns.

## 2017-01-26 ENCOUNTER — Ambulatory Visit: Payer: Medicaid Other | Attending: Neurosurgery | Admitting: Physical Therapy

## 2017-01-26 ENCOUNTER — Encounter: Payer: Self-pay | Admitting: Physical Therapy

## 2017-01-26 DIAGNOSIS — M5441 Lumbago with sciatica, right side: Secondary | ICD-10-CM | POA: Insufficient documentation

## 2017-01-26 DIAGNOSIS — M62830 Muscle spasm of back: Secondary | ICD-10-CM | POA: Diagnosis present

## 2017-01-26 DIAGNOSIS — G8929 Other chronic pain: Secondary | ICD-10-CM | POA: Insufficient documentation

## 2017-01-26 DIAGNOSIS — M6281 Muscle weakness (generalized): Secondary | ICD-10-CM | POA: Insufficient documentation

## 2017-01-26 NOTE — Therapy (Signed)
[facility] [address] [place], [place], [address] Phone: [phone]   Fax:  [phone]  Physical Therapy Evaluation  Patient Details  Name: Brent AlaminJeremy W Stout MRN: [id number] Date of Birth: 1976/09/02 Referring Provider: Donalee CitrinGary Cram MD  Encounter Date: 01/26/2017      PT End of Session - 01/26/17 1138    Visit Number 1   Number of Visits 1   Date for PT Re-Evaluation 01/27/17   Authorization Type Medicaid   PT Start Time 1055   PT Stop Time 1145   PT Time Calculation (min) 50 min   Activity Tolerance Patient tolerated treatment well   Behavior [facility] for tasks assessed/performed      Past Medical History:  Diagnosis Date  . Allergy   . Back pain   . Chronic pain    back pain  . GERD (gastroesophageal reflux disease)    tums as needed  . H/O hiatal hernia   . Hypertension    10/23/16 denies now  . 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 Seizures (HCC)    previously on tegretol, thought to be secondary to Ultram on one occurance  . Seizures (HCC)    last 2-08  from medication    Past Surgical History:  Procedure Laterality Date  . APPENDECTOMY      as teen  . BACK SURGERY  05/15/2003, 07/13/2014   x 2   . pyloric stenosis     546 weeks old    There were no vitals filed for this visit.       Subjective Assessment - 01/26/17 1103    Subjective pt is a 41 y.o M with CC of chronic low back pain that he thinks it could be from the lumbar back in 2015, and had laminectomy in 2004. Since the last surgery the pain fluctuates depending on activity. recent exacerbation in the last 6-8 months the RLE referral of N/T and pain into the toes.     Limitations Sitting;Lifting;Standing;Walking;House hold activities   Diagnostic tests x-ray and MRI   Patient Stated Goals to get the muscles to calm down with spasm, know how to treatment pain,    Currently in Pain? Yes   Pain Score 4    Pain  Location Back   Pain Orientation Right;Lower   Pain Descriptors / Indicators Pins and needles;Shooting;Burning;Aching;Dull   Pain Radiating Towards to the R LE into the foot.    Pain Onset More than a month ago   Pain Frequency Intermittent   Aggravating Factors  prolonged standing/ walking, depends on activity can vary. lifting    Pain Relieving Factors muscle relaxer, hot shower, heating pad, walking            [facility] - 01/26/17 1109      Assessment   Medical Diagnosis Low back pain   Referring Provider Donalee CitrinGary Cram MD   Onset Date/Surgical Date --  chronic since 2004 with multiple surgeries fusion in 2015   Hand Dominance Left   Next MD Visit --  beginning of March   Prior Therapy unable to say     Precautions   Precautions None     Restrictions   Weight Bearing Restrictions No     Balance Screen   Has the patient fallen in the past 6 months No   Has the patient had a decrease in activity level because of a fear of falling?  No   Is  the patient reluctant to leave their home because of a fear of falling?  No     Home Nurse, mental health Private residence   Living Arrangements Children   Available Help at Discharge Available PRN/intermittently   Type of Home House   Home Access Stairs to enter   Entrance Stairs-Number of Steps 0  goes into the basement   Home Layout Two level   Alternate Level Stairs-Number of Steps 12   Alternate Level Stairs-Rails Can reach both     Prior Function   Level of Independence Independent with basic ADLs     Posture/Postural Control   Posture/Postural Control Postural limitations   Postural Limitations Rounded Shoulders;Forward head     ROM / Strength   AROM / PROM / Strength AROM;Strength     AROM   AROM Assessment Site Lumbar   Lumbar Flexion 42  End range pain   Lumbar Extension 20  pain during movement   Lumbar - Right Side Bend 8  pain during motion   Lumbar - Left Side Bend 12  pain during  motion     Strength   Strength Assessment Site Hip;Knee   Right/Left Hip Right;Left   Right Hip Flexion 5/5   Right Hip Extension 2-/5   Right Hip ABduction 4/5   Right Hip ADduction 5/5   Left Hip Flexion 4+/5   Left Hip Extension 2+/5   Left Hip ABduction 4/5   Left Hip ADduction 5/5   Right/Left Knee Right;Left   Right Knee Flexion 4+/5   Right Knee Extension 4+/5   Left Knee Flexion 5/5   Left Knee Extension 5/5     Palpation   Palpation comment Significant spasm in thoracolumbar paraspinals R>L, tendernss at R PSIS,      Special Tests    Special Tests Lumbar;Sacrolliac Tests   Lumbar Tests Prone Knee Bend Test;Straight Leg Raise   Sacroiliac Tests  Sacral Thrust     Prone Knee Bend Test   Findings Positive   Comment --  both sides     Sacral thrust    Findings Negative   Side Right                           PT Education - 01/26/17 1137    Education provided Yes   Education Details evaluation findings, HEP, posture education, [facility]OPE clinic handout   Person(s) Educated Patient   Methods Explanation;Verbal cues;Handout   Comprehension Verbalized understanding;Verbal cues required                    Plan - 01/26/17 1139    Clinical Impression Statement Mr. Pasch present to OPPT as a moderate complexity evaluation due to involved PMHx, fluctuating symptoms and CC of low back pain and RLE referral of pain and N/T. He demonstraes limited trunk mobility due to pain, weakness noted in bil LE with pain during testing. palpation revealed tightness in bil lumbar paraspinals with R>L. Provided pt with HEP wiht progression and [facility]OPE clinic handout.    PT Frequency One time visit   PT Next Visit Plan medicaid   PT Home Exercise Plan SEE pt instruction section.    Consulted and Agree with Plan of Care Patient      Patient will benefit from skilled therapeutic intervention in order to improve the following deficits and impairments:  Pain,  Improper body mechanics, Postural dysfunction, Decreased endurance, Decreased activity tolerance, Hypomobility, Decreased strength,  Increased fascial restricitons, Increased muscle spasms, Decreased range of motion  Visit Diagnosis: Chronic right-sided low back pain with right-sided sciatica - Plan: PT plan of care cert/re-cert  Muscle spasm of back - Plan: PT plan of care cert/re-cert  Muscle weakness (generalized) - Plan: PT plan of care cert/re-cert     Problem List Patient Active Problem List   Diagnosis Date Noted  . HNP (herniated nucleus pulposus), lumbar 07/13/2014  . Allergic rhinitis 07/19/2011  . Knee pain, right 04/15/2011  . Hypertension 04/14/2011  . Chronic pain syndrome 01/13/2011  . CANNABIS ABUSE 05/29/2008  . TOBACCO ABUSE 01/18/2008  . UNSPECIFIED EPISODIC MOOD DISORDER 01/09/2008  . SEIZURE DISORDER 04/21/2007  . BACK PAIN, LUMBAR, WITH RADICULOPATHY 03/22/2007   Lulu Riding PT, DPT, LAT, ATC  01/26/17  11:50 AM      [facility] [address] [place], [place], [address] Phone: [phone]   Fax:  [phone]  Name: TAFARI HUMISTON MRN: [id number] Date of Birth: [birth year]

## 2017-01-26 NOTE — Patient Instructions (Addendum)

## 2017-02-16 ENCOUNTER — Ambulatory Visit (INDEPENDENT_AMBULATORY_CARE_PROVIDER_SITE_OTHER): Payer: Medicaid Other

## 2017-02-16 ENCOUNTER — Ambulatory Visit (HOSPITAL_COMMUNITY)
Admission: EM | Admit: 2017-02-16 | Discharge: 2017-02-16 | Disposition: A | Discharge status: Home / Self Care | Payer: Medicaid Other | Attending: Family Medicine | Admitting: Family Medicine

## 2017-02-16 ENCOUNTER — Encounter (HOSPITAL_COMMUNITY): Payer: Self-pay | Admitting: Emergency Medicine

## 2017-02-16 DIAGNOSIS — R69 Illness, unspecified: Secondary | ICD-10-CM

## 2017-02-16 DIAGNOSIS — J111 Influenza due to unidentified influenza virus with other respiratory manifestations: Secondary | ICD-10-CM

## 2017-02-16 MED ORDER — HYDROCODONE-HOMATROPINE 5-1.5 MG/5ML PO SYRP: 5.0000 mL | ORAL_SOLUTION | Freq: Four times a day (QID) | ORAL | 0 refills | Status: DC | PRN

## 2017-02-16 NOTE — ED Triage Notes (Signed)
[facility] a [facility] and fever that started 2 days ago.

## 2017-02-16 NOTE — Discharge Instructions (Signed)
Drink plenty of fluids as discussed, use medicine as prescribed, or mucinex or delsym for cough. Return or see your doctor if further problems °

## 2017-02-16 NOTE — ED Provider Notes (Signed)
[facility]    CSN: [id number] Arrival date & time: 02/16/17  1047     History   Chief Complaint Chief Complaint  Patient presents with  . Cough    HPI Iverson AlaminJeremy W Gorin is a 41 y.o. male.   The history is provided by the patient.  Cough  Cough characteristics:  Productive Sputum characteristics:  Unable to specify Severity:  Moderate Onset quality:  Sudden Duration:  2 days Chronicity:  New Smoker: yes   Context: sick contacts, smoke exposure and upper respiratory infection   Context comment:  Family all sick. Associated symptoms: chills, fever and shortness of breath     Past Medical History:  Diagnosis Date  . Allergy   . Back pain   . Chronic pain    back pain  . GERD (gastroesophageal reflux disease)    tums as needed  . H/O hiatal hernia   . Hypertension    10/23/16 denies now  . 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 Seizures (HCC)    previously on tegretol, thought to be secondary to Ultram on one occurance  . Seizures (HCC)    last 2-08  from medication    Patient Active Problem List   Diagnosis Date Noted  . HNP (herniated nucleus pulposus), lumbar 07/13/2014  . Allergic rhinitis 07/19/2011  . Knee pain, right 04/15/2011  . Hypertension 04/14/2011  . Chronic pain syndrome 01/13/2011  . CANNABIS ABUSE 05/29/2008  . TOBACCO ABUSE 01/18/2008  . UNSPECIFIED EPISODIC MOOD DISORDER 01/09/2008  . SEIZURE DISORDER 04/21/2007  . BACK PAIN, LUMBAR, WITH RADICULOPATHY 03/22/2007    Past Surgical History:  Procedure Laterality Date  . APPENDECTOMY      as teen  . BACK SURGERY  05/15/2003, 07/13/2014   x 2   . pyloric stenosis     366 weeks old       Home Medications    Prior to Admission medications   Medication Sig Start Date End Date Taking? Authorizing Provider  methocarbamol (ROBAXIN) 750 MG tablet Take 1 tablet (750 mg total) by mouth 3 (three) times daily. 12/24/16  Yes Robyn K Wojeck, NP    HYDROcodone-homatropine (HYCODAN) 5-1.5 MG/5ML syrup Take 5 mLs by mouth every 6 (six) hours as needed for cough. 02/16/17   Linna HoffJames D Kindl, MD    Family History History reviewed. No pertinent family history.  Social History Social History  Substance Use Topics  . Smoking status: Current Every Day Smoker    Packs/day: 1.00    Years: 19.00    Types: E-cigarettes, Cigarettes  . Smokeless tobacco: Never Used  . Alcohol use No     Allergies   Ultram [tramadol]; Wellbutrin [bupropion]; Gabapentin; Bupropion hcl; Sulfamethoxazole-trimethoprim; and Tramadol hcl   Review of Systems Review of Systems  Constitutional: Positive for chills and fever.  HENT: Positive for congestion and postnasal drip.   Respiratory: Positive for cough and shortness of breath.   Cardiovascular: Negative.   Gastrointestinal: Negative.   Genitourinary: Negative.      Physical Exam Triage Vital Signs ED Triage Vitals  Enc Vitals Group     BP 02/16/17 1132 111/76     Pulse Rate 02/16/17 1132 72     Resp 02/16/17 1132 18     Temp 02/16/17 1132 98.5 F (36.9 C)     Temp Source 02/16/17 1132 Oral     SpO2 02/16/17 1132 99 %     Weight --  Height --      Head Circumference --      Peak Flow --      Pain Score 02/16/17 1131 7     Pain Loc --      Pain Edu? --      Excl. in GC? --    No data found.   Updated Vital Signs BP 111/76 (BP Location: Right Arm)   Pulse 72   Temp 98.5 F (36.9 C) (Oral)   Resp 18   SpO2 99%   Visual Acuity Right Eye Distance:   Left Eye Distance:   Bilateral Distance:    Right Eye Near:   Left Eye Near:    Bilateral Near:     Physical Exam  Constitutional: He is oriented to person, place, and time. He appears well-developed and well-nourished. He appears distressed.  HENT:  Right Ear: External ear normal.  Left Ear: External ear normal.  Nose: Nose normal.  Mouth/Throat: Oropharynx is clear and moist.  Eyes: Conjunctivae are normal. Pupils are  equal, round, and reactive to light.  Neck: Normal range of motion. Neck supple.  Cardiovascular: Normal rate, regular rhythm, normal heart sounds and intact distal pulses.   Pulmonary/Chest: Effort normal and breath sounds normal.  Lymphadenopathy:    He has no cervical adenopathy.  Neurological: He is alert and oriented to person, place, and time.  Skin: Skin is warm and dry.  Nursing note and vitals reviewed.    UC Treatments / Results  Labs (all labs ordered are listed, but only abnormal results are displayed) Labs Reviewed - No data to display  EKG  EKG Interpretation None       Radiology Dg Chest 2 View  Result Date: 02/16/2017 CLINICAL DATA:  Coughing congestion beginning 2 days ago. Shortness of breath and fever. EXAM: CHEST  2 VIEW COMPARISON:  06/29/2012 FINDINGS: Heart size is normal. Mediastinal shadows are normal. The lungs are clear. No bronchial thickening. No infiltrate, mass, effusion or collapse. Pulmonary vascularity is normal. No bony abnormality. IMPRESSION: Normal chest Electronically Signed   By: Paulina Fusi M.D.   On: 02/16/2017 12:31   X-rays reviewed and report per radiologist.  Procedures Procedures (including critical care time)  Medications Ordered in UC Medications - No data to display   Initial Impression / Assessment and Plan / UC Course  I have reviewed the triage vital signs and the nursing notes.  Pertinent labs & imaging results that were available during my care of the patient were reviewed by me and considered in my medical decision making (see chart for details).       Final Clinical Impressions(s) / UC Diagnoses   Final diagnoses:  Influenza-like illness    New Prescriptions New Prescriptions   HYDROCODONE-HOMATROPINE (HYCODAN) 5-1.5 MG/5ML SYRUP    Take 5 mLs by mouth every 6 (six) hours as needed for cough.     Linna Hoff, MD 02/16/17 1323

## 2017-11-03 IMAGING — DX DG CHEST 2V
2 series · 2 of 2 positions shown · non-contrast
Comparison: 06/29/2012

CLINICAL DATA: Coughing congestion beginning 2 days ago. Shortness
of breath and fever.

EXAM:
CHEST  2 VIEW

[chest pa]
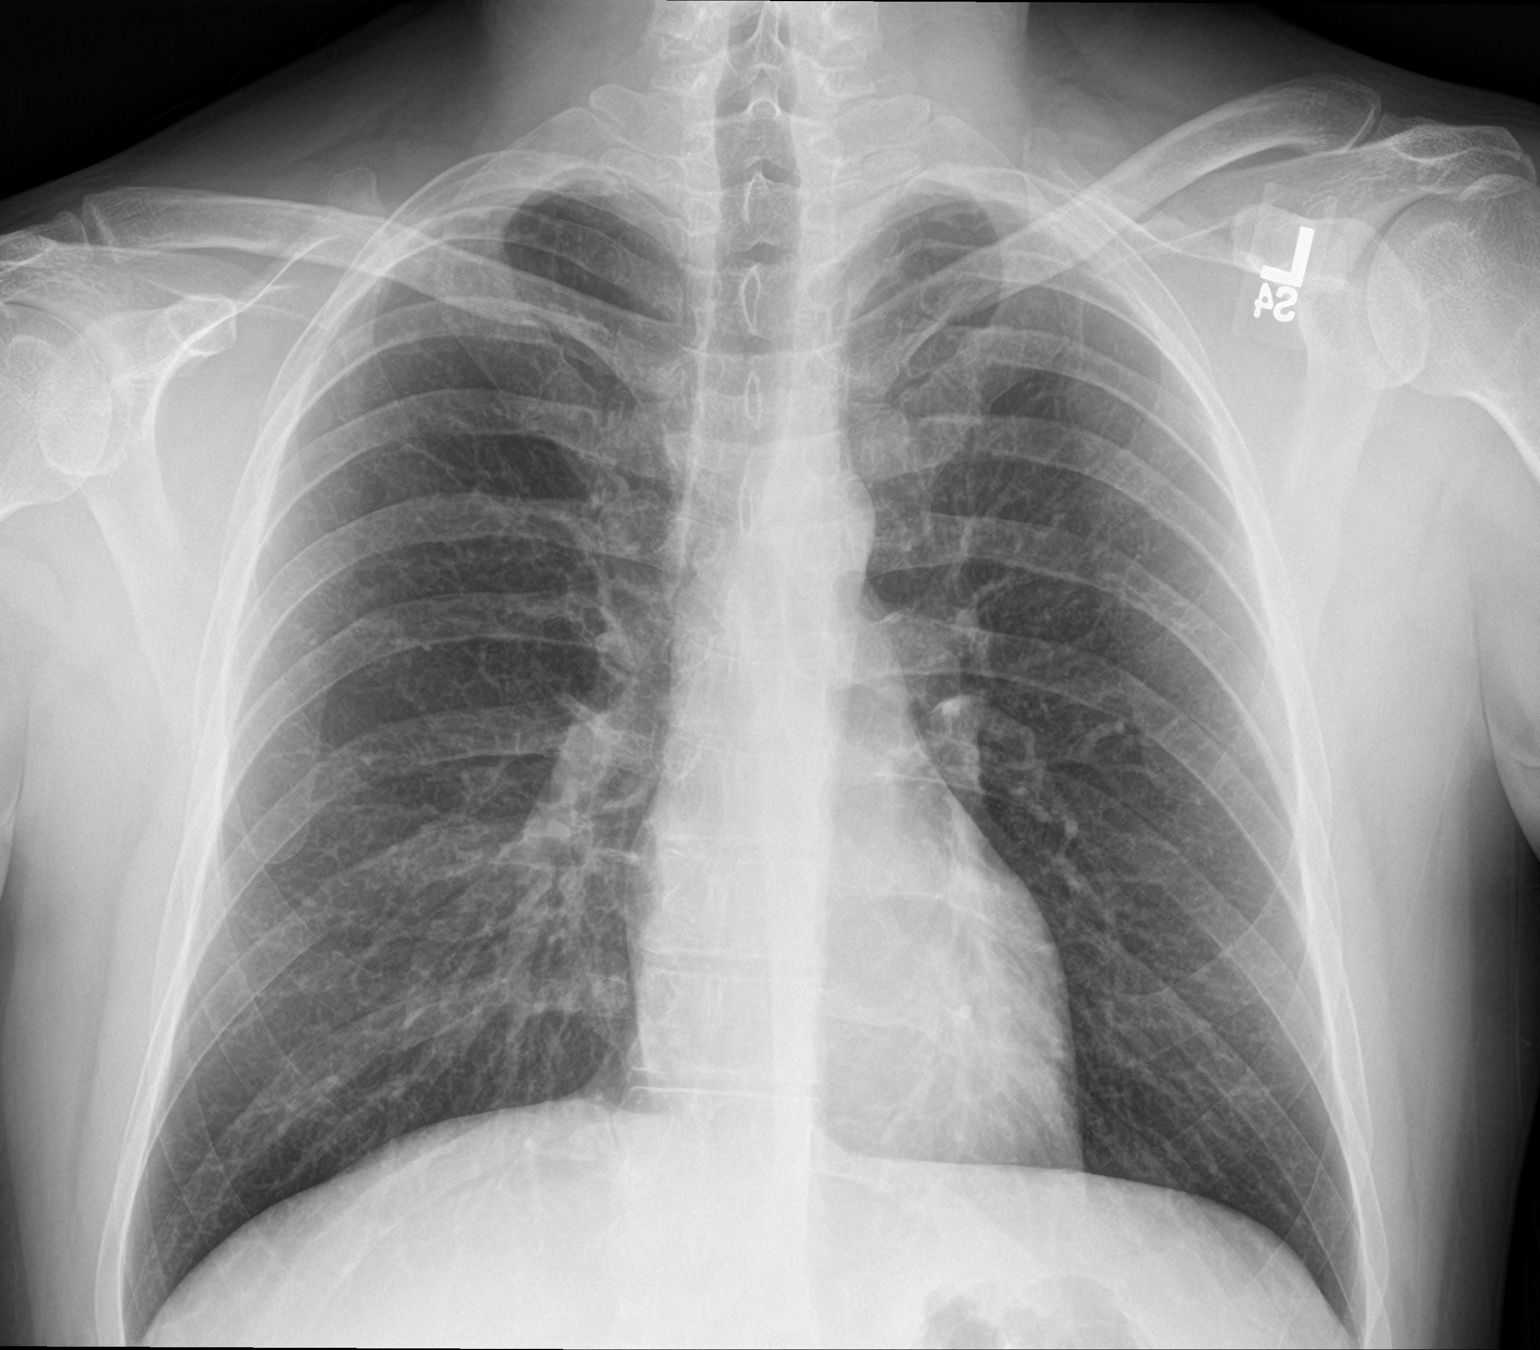

[chest lat]
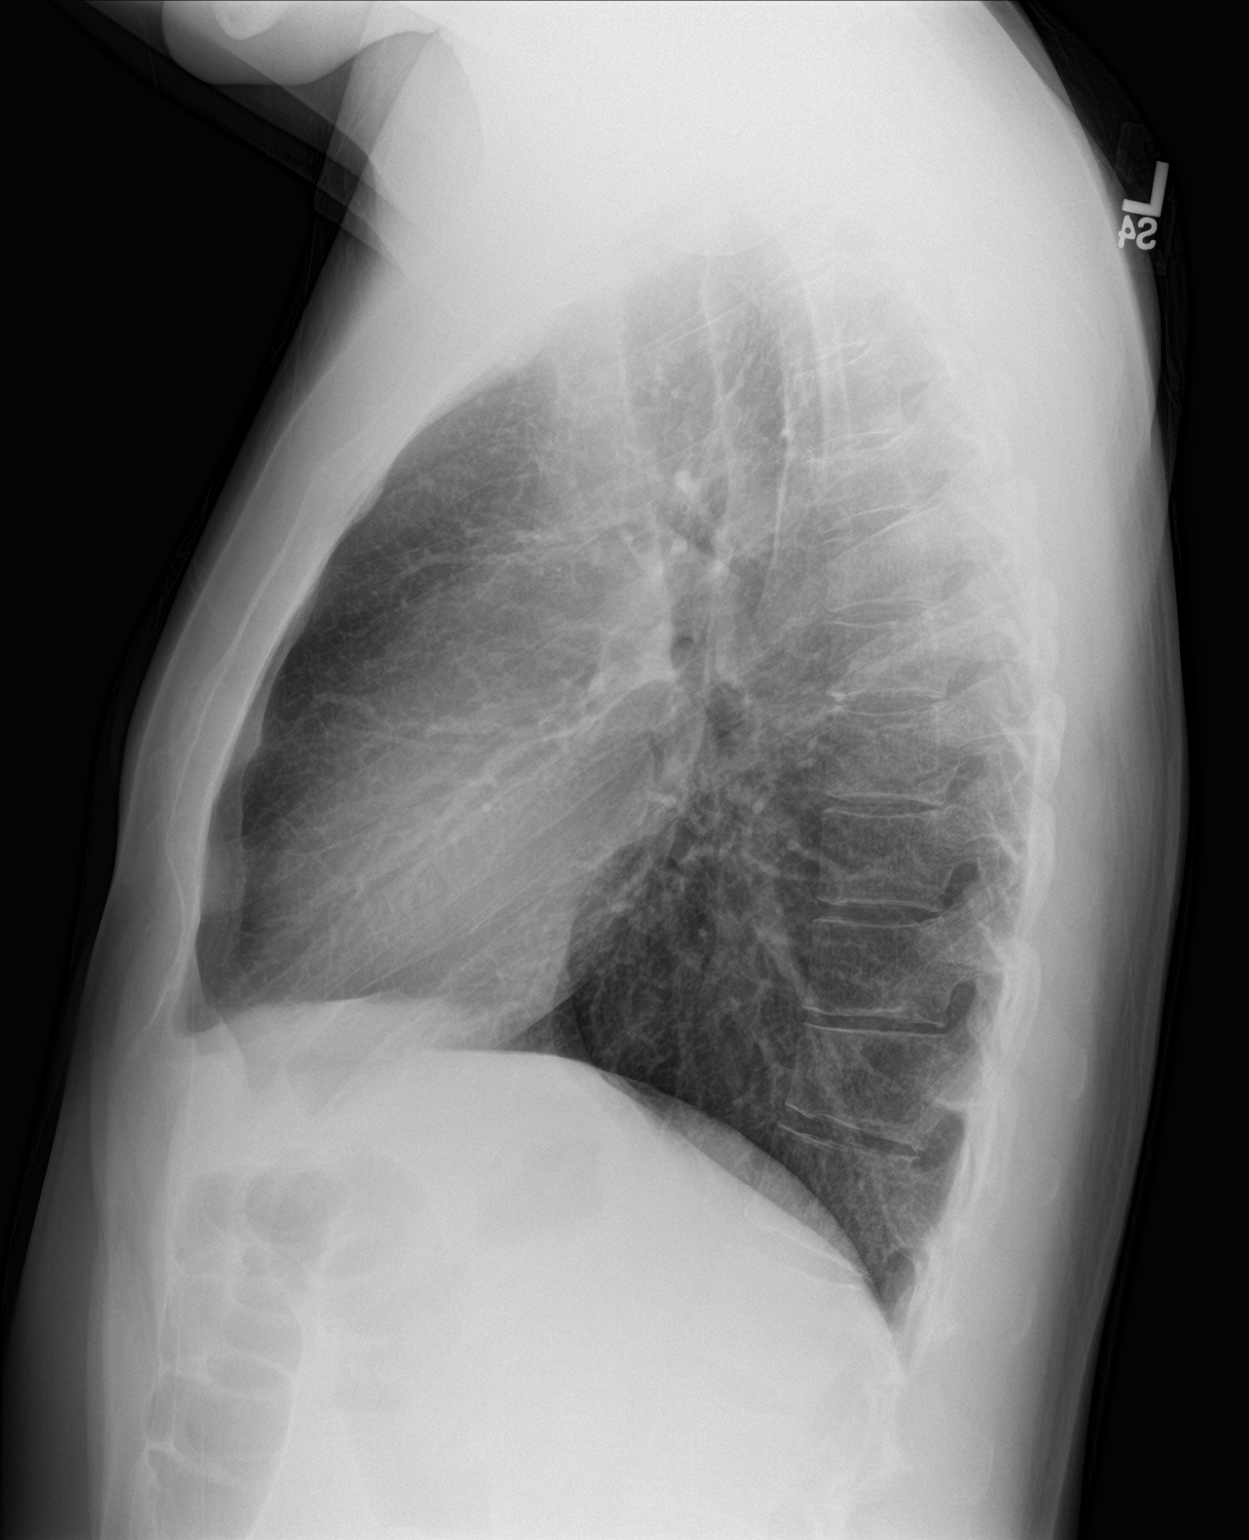

[2 of 2 positions shown; findings below may reference images not displayed]

FINDINGS: Heart size is normal. Mediastinal shadows are normal. The lungs are
clear. No bronchial thickening. No infiltrate, mass, effusion or
collapse. Pulmonary vascularity is normal. No bony abnormality.
IMPRESSION: Normal chest

## 2017-11-24 ENCOUNTER — Other Ambulatory Visit: Payer: Self-pay

## 2017-11-24 ENCOUNTER — Ambulatory Visit (HOSPITAL_COMMUNITY)
Admission: EM | Admit: 2017-11-24 | Discharge: 2017-11-24 | Disposition: A | Discharge status: Home / Self Care | Payer: Medicaid Other | Attending: Family Medicine | Admitting: Family Medicine

## 2017-11-24 ENCOUNTER — Encounter (HOSPITAL_COMMUNITY): Payer: Self-pay | Admitting: Family Medicine

## 2017-11-24 DIAGNOSIS — S46812A Strain of other muscles, fascia and tendons at shoulder and upper arm level, left arm, initial encounter: Secondary | ICD-10-CM | POA: Diagnosis not present

## 2017-11-24 MED ORDER — METHOCARBAMOL 750 MG PO TABS: 750.0000 mg | ORAL_TABLET | Freq: Three times a day (TID) | ORAL | 0 refills | Status: DC

## 2017-11-24 NOTE — ED Provider Notes (Signed)
[facility]   [id number] 11/24/17 Arrival Time: 1109  ASSESSMENT & PLAN:  1. Strain of left trapezius muscle, initial encounter     Meds ordered this encounter  Medications  . methocarbamol (ROBAXIN) 750 MG tablet    Sig: Take 1 tablet (750 mg total) by mouth 3 (three) times daily.    Dispense:  20 tablet    Refill:  0   No work note needed. Will plan on f/u with PCP or here if not improving over the next several days. OTC as needed. Reviewed expectations re: course of current medical issues. Questions answered. Outlined signs and symptoms indicating need for more acute intervention. Patient verbalized understanding. After Visit Summary given.   SUBJECTIVE:  Brent Reed is a 41 y.o. male who reports pain of his L shoulder/upper back. Gradual onset over the past 1-2 days. No specific injury/trauma reported. L-handed. No extremity sensation changes or weakness. "Feels stiff." Discomfort described as dull ache; worsened with LUE abduction. No OTC treatment. Has used Robaxin for similar symptoms in the past. No specific neck pain. ROS: As per HPI.   OBJECTIVE:  Vitals:   11/24/17 1137  BP: 133/80  Pulse: 73  Resp: 18  Temp: (!) 97.3 F (36.3 C)  SpO2: 100%    General appearance: alert; no distress Extremities: no cyanosis or edema; symmetrical with no gross deformities; tenderness over his L trapezius muscle with no swelling and no bruising; LUE with FROM (with discomfort); no bony tenderness CV: normal extremity capillary refill Skin: warm and dry Neurologic: normal gait; normal symmetric reflexes in all extremities; normal sensation Psychological: alert and cooperative; normal mood and affect   Allergies  Allergen Reactions  . Ultram [Tramadol] Other (See Comments)    seizures  . Wellbutrin [Bupropion] Other (See Comments)    seizures  . Gabapentin Other (See Comments)    Mood change- suicidal thought  . Bupropion Hcl     REACTION: Seizures  .  Sulfamethoxazole-Trimethoprim     REACTION: rash  . Tramadol Hcl     REACTION: seizure    Past Medical History:  Diagnosis Date  . Allergy   . Back pain   . Chronic pain    back pain  . GERD (gastroesophageal reflux disease)    tums as needed  . H/O hiatal hernia   . Hypertension    10/23/16 denies now  . 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 Seizures (HCC)    previously on tegretol, thought to be secondary to Ultram on one occurance  . Seizures (HCC)    last 2-08  from medication   Social History   Socioeconomic History  . Marital status: Divorced    Spouse name: Not on file  . Number of children: 3  . Years of education: 10  . Highest education level: Not on file  Social Needs  . Financial resource strain: Not on file  . Food insecurity - worry: Not on file  . Food insecurity - inability: Not on file  . Transportation needs - medical: Not on file  . Transportation needs - non-medical: Not on file  Occupational History    Comment: [employer]  Tobacco Use  . Smoking status: Current Every Day Smoker    Packs/day: 1.00    Years: 19.00    Pack years: 19.00    Types: E-cigarettes, Cigarettes  . Smokeless tobacco: Never Used  Substance and Sexual Activity  . Alcohol use: No  . Drug  use: No  . Sexual activity: Yes  Other Topics Concern  . Not on file  Social History Narrative   ** Merged History Encounter **   Lives with 3 children   Caffeine - Mtn Dew all day       History reviewed. No pertinent family history. Past Surgical History:  Procedure Laterality Date  . APPENDECTOMY      as teen  . BACK SURGERY  05/15/2003, 07/13/2014   x 2   . pyloric stenosis     586 weeks old     Mardella LaymanHagler, Brent Tipping, MD 11/24/17 1156

## 2017-11-24 NOTE — ED Triage Notes (Signed)
Pt c/o pain in his L neck and L shoulder blade, pain worse with movement. No injury.

## 2018-03-15 ENCOUNTER — Ambulatory Visit (HOSPITAL_COMMUNITY)
Admission: EM | Admit: 2018-03-15 | Discharge: 2018-03-15 | Disposition: A | Discharge status: Home / Self Care | Payer: Medicaid Other | Attending: Family Medicine | Admitting: Family Medicine

## 2018-03-15 ENCOUNTER — Encounter (HOSPITAL_COMMUNITY): Payer: Self-pay | Admitting: Emergency Medicine

## 2018-03-15 DIAGNOSIS — M5441 Lumbago with sciatica, right side: Secondary | ICD-10-CM | POA: Diagnosis not present

## 2018-03-15 DIAGNOSIS — B349 Viral infection, unspecified: Secondary | ICD-10-CM

## 2018-03-15 DIAGNOSIS — G8929 Other chronic pain: Secondary | ICD-10-CM | POA: Diagnosis not present

## 2018-03-15 MED ORDER — HYDROCODONE-ACETAMINOPHEN 5-325 MG PO TABS: 1.0000 | ORAL_TABLET | Freq: Four times a day (QID) | ORAL | 0 refills | Status: DC | PRN

## 2018-03-15 MED ORDER — FLUTICASONE PROPIONATE 50 MCG/ACT NA SUSP: 2.0000 | Freq: Every day | NASAL | 0 refills | Status: DC

## 2018-03-15 MED ORDER — METHOCARBAMOL 750 MG PO TABS: 750.0000 mg | ORAL_TABLET | Freq: Three times a day (TID) | ORAL | 0 refills | Status: DC

## 2018-03-15 MED ORDER — BENZONATATE 100 MG PO CAPS: 100.0000 mg | ORAL_CAPSULE | Freq: Three times a day (TID) | ORAL | 0 refills | Status: DC

## 2018-03-15 MED ORDER — IPRATROPIUM BROMIDE 0.06 % NA SOLN: 2.0000 | Freq: Four times a day (QID) | NASAL | 0 refills | Status: DC

## 2018-03-15 NOTE — Discharge Instructions (Signed)
Tessalon for cough. You can get over the counter cough syrup to help control cough further. Start flonase, atrovent nasal spray for nasal congestion/drainage. You can use over the counter nasal saline rinse such as neti pot for nasal congestion. Keep hydrated, your urine should be clear to pale yellow in color. Tylenol/motrin for fever and pain. Monitor for any worsening of symptoms, chest pain, shortness of breath, wheezing, swelling of the throat, follow up for reevaluation.   You can take Vicodin as needed to help with back pain.  Robaxin as needed to help with muscle spasms.  Please follow-up with your doctor for further management of chronic back pain.  If experiencing worsening of symptoms, numbness and tingling of the inner thighs, loss of bladder or bowel control, go to the emergency department for further evaluation.

## 2018-03-15 NOTE — ED Triage Notes (Signed)
Pt here for back pain chronic in nature as well as URI sx

## 2018-03-15 NOTE — ED Provider Notes (Signed)
[facility]    CSN: [id number] Arrival date & time: 03/15/18  1013     History   Chief Complaint Chief Complaint  Patient presents with  . Back Pain  . URI    HPI DEREL MCGLASSON is a 42 y.o. male.   42 year old male with history of chronic back pain  status post lumbar fusion comes in for exacerbation of chronic back pain and 1 day history of URI symptoms.  States that started having nonproductive cough, cold chills without fever, rhinorrhea, nasal congestion.  The coughing fits exacerbated the chronic back pain, causing muscle spasms and worsening pain.  States now with right leg sciatica, which has happened in the past during muscle spasms.  Denies numbness, tingling, loss of bladder or bowel control.  Denies new injury.  Patient states in the past Tylenol, ibuprofen has not helped with the symptoms.  Mobic causes stomach pain.  Toradol injection does not improve the symptoms.  Therefore, he has been resting and has not taken anything for the symptoms.  Is a current everyday smoker, 11-pack-year history.      Past Medical History:  Diagnosis Date  . Allergy   . Back pain   . Chronic pain    back pain  . GERD (gastroesophageal reflux disease)    tums as needed  . H/O hiatal hernia   . Hypertension    10/23/16 denies now  . 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 Seizures (HCC)    previously on tegretol, thought to be secondary to Ultram on one occurance  . Seizures (HCC)    last 2-08  from medication    Patient Active Problem List   Diagnosis Date Noted  . HNP (herniated nucleus pulposus), lumbar 07/13/2014  . Allergic rhinitis 07/19/2011  . Knee pain, right 04/15/2011  . Hypertension 04/14/2011  . Chronic pain syndrome 01/13/2011  . CANNABIS ABUSE 05/29/2008  . TOBACCO ABUSE 01/18/2008  . UNSPECIFIED EPISODIC MOOD DISORDER 01/09/2008  . SEIZURE DISORDER 04/21/2007  . BACK PAIN, LUMBAR, WITH RADICULOPATHY 03/22/2007    Past Surgical  History:  Procedure Laterality Date  . APPENDECTOMY      as teen  . BACK SURGERY  05/15/2003, 07/13/2014   x 2   . pyloric stenosis     70 weeks old       Home Medications    Prior to Admission medications   Medication Sig Start Date End Date Taking? Authorizing Provider  benzonatate (TESSALON) 100 MG capsule Take 1 capsule (100 mg total) by mouth every 8 (eight) hours. 03/15/18   Cathie Hoops, Amy V, PA-C  fluticasone (FLONASE) 50 MCG/ACT nasal spray Place 2 sprays into both nostrils daily. 03/15/18   Cathie Hoops, Amy V, PA-C  HYDROcodone-acetaminophen (NORCO/VICODIN) 5-325 MG tablet Take 1 tablet by mouth every 6 (six) hours as needed. 03/15/18   Cathie Hoops, Amy V, PA-C  ipratropium (ATROVENT) 0.06 % nasal spray Place 2 sprays into both nostrils 4 (four) times daily. 03/15/18   Cathie Hoops, Amy V, PA-C  methocarbamol (ROBAXIN) 750 MG tablet Take 1 tablet (750 mg total) by mouth 3 (three) times daily. 03/15/18   Belinda Fisher, PA-C    Family History History reviewed. No pertinent family history.  Social History Social History   Tobacco Use  . Smoking status: Current Every Day Smoker    Packs/day: 1.00    Years: 19.00    Pack years: 19.00    Types: E-cigarettes, Cigarettes  . Smokeless tobacco: Never  Used  Substance Use Topics  . Alcohol use: No  . Drug use: No     Allergies   Ultram [tramadol]; Wellbutrin [bupropion]; Gabapentin; Bupropion hcl; Sulfamethoxazole-trimethoprim; and Tramadol hcl   Review of Systems Review of Systems  Reason unable to perform ROS: See HPI as above.     Physical Exam Triage Vital Signs ED Triage Vitals  Enc Vitals Group     BP 03/15/18 1114 (!) 148/55     Pulse Rate 03/15/18 1114 77     Resp 03/15/18 1114 18     Temp 03/15/18 1114 99 F (37.2 C)     Temp Source 03/15/18 1114 Oral     SpO2 03/15/18 1114 97 %     Weight --      Height --      Head Circumference --      Peak Flow --      Pain Score 03/15/18 1116 8     Pain Loc --      Pain Edu? --      Excl. in GC?  --    No data found.  Updated Vital Signs BP (!) 148/55 (BP Location: Right Arm)   Pulse 77   Temp 99 F (37.2 C) (Oral)   Resp 18   SpO2 97%   Physical Exam  Constitutional: He is oriented to person, place, and time. He appears well-developed and well-nourished. No distress.  Patient sitting side ways, unable to keep back straight.  HENT:  Head: Normocephalic and atraumatic.  Right Ear: Tympanic membrane, external ear and ear canal normal. Tympanic membrane is not erythematous and not bulging.  Left Ear: Tympanic membrane, external ear and ear canal normal. Tympanic membrane is not erythematous and not bulging.  Nose: Rhinorrhea present. Right sinus exhibits no maxillary sinus tenderness and no frontal sinus tenderness. Left sinus exhibits no maxillary sinus tenderness and no frontal sinus tenderness.  Mouth/Throat: Uvula is midline, oropharynx is clear and moist and mucous membranes are normal.  Eyes: Conjunctivae are normal. Pupils are equal, round, and reactive to light.  Neck: Normal range of motion. Neck supple.  Cardiovascular: Normal rate, regular rhythm and normal heart sounds. Exam reveals no gallop and no friction rub.  No murmur heard. Pulmonary/Chest: Effort normal and breath sounds normal. He has no decreased breath sounds. He has no wheezes. He has no rhonchi. He has no rales.  Musculoskeletal:  Tenderness to palpation of midline and right lumbar region.  No tenderness to palpation of hips.  Deferred back range of motion in hips due to patient's pain.  Sensation intact and equal bilaterally.  Unable to assess straight leg raise due to patient's pain.  Lymphadenopathy:    He has no cervical adenopathy.  Neurological: He is alert and oriented to person, place, and time.  Skin: Skin is warm and dry.  Psychiatric: He has a normal mood and affect. His behavior is normal. Judgment normal.     UC Treatments / Results  Labs (all labs ordered are listed, but only abnormal  results are displayed) Labs Reviewed - No data to display  EKG  EKG Interpretation None       Radiology No results found.  Procedures Procedures (including critical care time)  Medications Ordered in UC Medications - No data to display   Initial Impression / Assessment and Plan / UC Course  I have reviewed the triage vital signs and the nursing notes.  Pertinent labs & imaging results that were available during my care  of the patient were reviewed by me and considered in my medical decision making (see chart for details).    Discussed with patient history and exam most consistent with viral URI. Symptomatic treatment as needed. Push fluids. Return precautions given.   Patient with history of lumbar fusion, has had 2 exacerbation of pain since surgery.  States had resolution of symptoms after Vicodin and muscle relaxant for a few days.  Current episode believed to be exacerbated by URI symptoms.  Given patient's history and exam, okay to fill short course of Norco, Robaxin as needed.  Patient to follow-up with PCP/neurosurgeon for further evaluation of chronic back pain if it does not improve.  Return precautions given.  Patient expresses understanding and agrees to plan.  Final Clinical Impressions(s) / UC Diagnoses   Final diagnoses:  Chronic right-sided low back pain with right-sided sciatica  Viral illness    ED Discharge Orders        Ordered    methocarbamol (ROBAXIN) 750 MG tablet  3 times daily     03/15/18 1139    HYDROcodone-acetaminophen (NORCO/VICODIN) 5-325 MG tablet  Every 6 hours PRN     03/15/18 1139    fluticasone (FLONASE) 50 MCG/ACT nasal spray  Daily     03/15/18 1139    ipratropium (ATROVENT) 0.06 % nasal spray  4 times daily     03/15/18 1139    benzonatate (TESSALON) 100 MG capsule  Every 8 hours     03/15/18 1139       Controlled Substance Prescriptions [place] Controlled Substance Registry consulted? Yes, I have consulted the [place] Controlled  Substances Registry for this patient, and feel the risk/benefit ratio today is favorable for proceeding with this prescription for a controlled substance.   Belinda FisherYu, Amy V, PA-C 03/15/18 1146

## 2018-07-11 ENCOUNTER — Other Ambulatory Visit: Payer: Self-pay

## 2018-07-11 ENCOUNTER — Ambulatory Visit (HOSPITAL_COMMUNITY)
Admission: EM | Admit: 2018-07-11 | Discharge: 2018-07-11 | Disposition: A | Discharge status: Home / Self Care | Payer: Medicaid Other | Attending: Family Medicine | Admitting: Family Medicine

## 2018-07-11 ENCOUNTER — Encounter (HOSPITAL_COMMUNITY): Payer: Self-pay | Admitting: Emergency Medicine

## 2018-07-11 DIAGNOSIS — G8929 Other chronic pain: Secondary | ICD-10-CM

## 2018-07-11 DIAGNOSIS — M5441 Lumbago with sciatica, right side: Secondary | ICD-10-CM

## 2018-07-11 MED ORDER — HYDROCODONE-ACETAMINOPHEN 5-325 MG PO TABS: 1.0000 | ORAL_TABLET | Freq: Four times a day (QID) | ORAL | 0 refills | Status: DC | PRN

## 2018-07-11 MED ORDER — METHOCARBAMOL 750 MG PO TABS: 750.0000 mg | ORAL_TABLET | Freq: Three times a day (TID) | ORAL | 0 refills | Status: DC

## 2018-07-11 NOTE — ED Provider Notes (Signed)
[facility]    CSN: [id number] Arrival date & time: 07/11/18  1805     History   Chief Complaint Chief Complaint  Patient presents with  . Appointment    (APPT 6:26PM)  . Back Pain    HPI Brent Reed is a 42 y.o. male.   42 year old male comes in for 1 day exacerbation of chronic back pain.  Has a history of spinal fusion to the lumbar/sacral region.  States this resolved his chronic back pain, but once in a while will have exacerbations if he moves "the wrong way".  States he was giving a bath to his dog yesterday, dog tried to run away so he twisted his back to catch it, and started feeling the pain.  He has right-sided lumbar pain with tightness to the upper back and pain radiation down the leg.  Has numbness tingling to the lateral and posterior aspect of the leg, which is normal for him when he has these exacerbations.  He denies saddle anesthesia, loss of bladder or bowel control.  States that prednisone has not worked for his sciatica in the past.  Had pyloric stenosis as a child, and has not been able to tolerate oral NSAIDs.  He is tried tramadol in the past that caused seizures.  States usually able to control with 1 to 2 days of Norco, and Robaxin.  States he gets 1-2 exacerbations a year, so has not had been needing to follow-up with orthopedics.     Past Medical History:  Diagnosis Date  . Allergy   . Back pain   . Chronic pain    back pain  . GERD (gastroesophageal reflux disease)    tums as needed  . H/O hiatal hernia   . Hypertension    10/23/16 denies now  . 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 Seizures (HCC)    previously on tegretol, thought to be secondary to Ultram on one occurance  . Seizures (HCC)    last 2-08  from medication    Patient Active Problem List   Diagnosis Date Noted  . HNP (herniated nucleus pulposus), lumbar 07/13/2014  . Allergic rhinitis 07/19/2011  . Knee pain, right 04/15/2011  . Hypertension  04/14/2011  . Chronic pain syndrome 01/13/2011  . CANNABIS ABUSE 05/29/2008  . TOBACCO ABUSE 01/18/2008  . UNSPECIFIED EPISODIC MOOD DISORDER 01/09/2008  . SEIZURE DISORDER 04/21/2007  . BACK PAIN, LUMBAR, WITH RADICULOPATHY 03/22/2007    Past Surgical History:  Procedure Laterality Date  . APPENDECTOMY      as teen  . BACK SURGERY  05/15/2003, 07/13/2014   x 2   . pyloric stenosis     536 weeks old       Home Medications    Prior to Admission medications   Medication Sig Start Date End Date Taking? Authorizing Provider  ibuprofen (ADVIL,MOTRIN) 200 MG tablet Take 200 mg by mouth every 6 (six) hours as needed.   Yes [provider]  HYDROcodone-acetaminophen (NORCO/VICODIN) 5-325 MG tablet Take 1 tablet by mouth every 6 (six) hours as needed. 07/11/18   Cathie HoopsYu, Amy V, PA-C  methocarbamol (ROBAXIN) 750 MG tablet Take 1 tablet (750 mg total) by mouth 3 (three) times daily. 07/11/18   Belinda FisherYu, Amy V, PA-C    Family History History reviewed. No pertinent family history.  Social History Social History   Tobacco Use  . Smoking status: Current Every Day Smoker    Packs/day: 1.00  Years: 19.00    Pack years: 19.00    Types: E-cigarettes, Cigarettes  . Smokeless tobacco: Never Used  Substance Use Topics  . Alcohol use: No  . Drug use: No     Allergies   Ultram [tramadol]; Wellbutrin [bupropion]; Gabapentin; Bupropion hcl; Sulfamethoxazole-trimethoprim; and Tramadol hcl   Review of Systems Review of Systems  Reason unable to perform ROS: See HPI as above.     Physical Exam Triage Vital Signs ED Triage Vitals  Enc Vitals Group     BP 07/11/18 1813 (!) 144/66     Pulse Rate 07/11/18 1813 66     Resp 07/11/18 1813 16     Temp 07/11/18 1813 98.7 F (37.1 C)     Temp Source 07/11/18 1813 Oral     SpO2 07/11/18 1813 99 %     Weight --      Height --      Head Circumference --      Peak Flow --      Pain Score 07/11/18 1825 8     Pain Loc --      Pain Edu? --       Excl. in GC? --    No data found.  Updated Vital Signs BP (!) 144/66 (BP Location: Right Arm)   Pulse 66   Temp 98.7 F (37.1 C) (Oral)   Resp 16   SpO2 99%   Physical Exam  Constitutional: He is oriented to person, place, and time. He appears well-developed and well-nourished. No distress.  HENT:  Head: Normocephalic and atraumatic.  Eyes: Pupils are equal, round, and reactive to light. Conjunctivae are normal.  Cardiovascular: Normal rate, regular rhythm and normal heart sounds. Exam reveals no gallop and no friction rub.  No murmur heard. Pulmonary/Chest: Effort normal and breath sounds normal. No accessory muscle usage or stridor. No respiratory distress. He has no decreased breath sounds. He has no wheezes. He has no rhonchi. He has no rales.  Musculoskeletal:  Diffuse tenderness to palpation of right back.  No obvious tenderness to palpation of spinous processes.  Patient able to twist back.  Deferred hip range of motion, straight leg raise due to patient's pain.  Sensation intact, slightly decreased at 4 out of 5 of lateral and posterior side of thigh and lower leg.  Sensation intact and equal bilaterally of the inner thighs.  Neurological: He is alert and oriented to person, place, and time.  Skin: Skin is warm and dry. He is not diaphoretic.     UC Treatments / Results  Labs (all labs ordered are listed, but only abnormal results are displayed) Labs Reviewed - No data to display  EKG None  Radiology No results found.  Procedures Procedures (including critical care time)  Medications Ordered in UC Medications - No data to display  Initial Impression / Assessment and Plan / UC Course  I have reviewed the triage vital signs and the nursing notes.  Pertinent labs & imaging results that were available during my care of the patient were reviewed by me and considered in my medical decision making (see chart for details).    Given patient's level of pain,  history with similar symptoms, deferred some ROM during exam. Will provide norco and robaxin for symptoms. Return precautions given. Discussed with patient if exacerbations become more frequent, will have to follow up with PCP/orthopedics for pain management. Patient expresses understanding and agrees to plan.  Final Clinical Impressions(s) / UC Diagnoses   Final diagnoses:  Chronic right-sided low back pain with right-sided sciatica    ED Prescriptions    Medication Sig Dispense Auth. Provider   methocarbamol (ROBAXIN) 750 MG tablet Take 1 tablet (750 mg total) by mouth 3 (three) times daily. 20 tablet Yu, Amy V, PA-C   HYDROcodone-acetaminophen (NORCO/VICODIN) 5-325 MG tablet Take 1 tablet by mouth every 6 (six) hours as needed. 12 tablet Belinda Fisher, PA-C     Controlled Substance Prescriptions [place] Controlled Substance Registry consulted? Yes, I have consulted the [place] Controlled Substances Registry for this patient, and feel the risk/benefit ratio today is favorable for proceeding with this prescription for a controlled substance.   Belinda Fisher, PA-C 07/11/18 1907

## 2018-07-11 NOTE — ED Triage Notes (Addendum)
Lower back pain and pain into right leg.  This has been occurring for 20 years.  Pain this time occurred when grabbing at a dog while bathing the dog

## 2018-07-11 NOTE — Discharge Instructions (Signed)
Norco and robaxin for back pain. Heat compress. Monitor for numbness tingling to the inner thighs, loss of bladder or bowel control, go to the emergency department for further evaluation.  Otherwise, follow-up with orthopedic for further evaluation if exacerbations become too frequent.

## 2018-09-22 ENCOUNTER — Other Ambulatory Visit: Payer: Self-pay

## 2018-09-22 ENCOUNTER — Ambulatory Visit (HOSPITAL_COMMUNITY)
Admission: EM | Admit: 2018-09-22 | Discharge: 2018-09-22 | Disposition: A | Discharge status: Home / Self Care | Payer: Commercial Managed Care - PPO | Attending: Family Medicine | Admitting: Family Medicine

## 2018-09-22 ENCOUNTER — Encounter (HOSPITAL_COMMUNITY): Payer: Self-pay | Admitting: Emergency Medicine

## 2018-09-22 DIAGNOSIS — J069 Acute upper respiratory infection, unspecified: Secondary | ICD-10-CM

## 2018-09-22 NOTE — ED Provider Notes (Signed)
[facility]    CSN: [id number] Arrival date & time: 09/22/18  1156     History   Chief Complaint Chief Complaint  Patient presents with  . URI  . Appointment    12:00 noon    HPI Brent Reed is a 42 y.o. male.   Brent Reed presents with complaints of congestion, fatigue, headache, slight cough, body aches, which started three days ago but worsened yesterday. No specific fevers. No nasal drainage. No ear pain. Mild sore throat. Pain 7/10. Has been taking tylenol, ibuprofen and benadryl which does help some. No new gi/gu complaints. No known ill contacts. Hx of allergies, chronic back pain, gerd, htn, pyloric stenosis, seizures.     ROS per HPI.      Past Medical History:  Diagnosis Date  . Allergy   . Back pain   . Chronic pain    back pain  . GERD (gastroesophageal reflux disease)    tums as needed  . H/O hiatal hernia   . Hypertension    10/23/16 denies now  . 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 Seizures (HCC)    previously on tegretol, thought to be secondary to Ultram on one occurance  . Seizures (HCC)    last 2-08  from medication    Patient Active Problem List   Diagnosis Date Noted  . HNP (herniated nucleus pulposus), lumbar 07/13/2014  . Allergic rhinitis 07/19/2011  . Knee pain, right 04/15/2011  . Hypertension 04/14/2011  . Chronic pain syndrome 01/13/2011  . CANNABIS ABUSE 05/29/2008  . TOBACCO ABUSE 01/18/2008  . UNSPECIFIED EPISODIC MOOD DISORDER 01/09/2008  . SEIZURE DISORDER 04/21/2007  . BACK PAIN, LUMBAR, WITH RADICULOPATHY 03/22/2007    Past Surgical History:  Procedure Laterality Date  . APPENDECTOMY      as teen  . BACK SURGERY  05/15/2003, 07/13/2014   x 2   . pyloric stenosis     65 weeks old       Home Medications    Prior to Admission medications   Medication Sig Start Date End Date Taking? Authorizing Provider  HYDROcodone-acetaminophen (NORCO/VICODIN) 5-325 MG tablet Take 1 tablet by  mouth every 6 (six) hours as needed. 07/11/18   Cathie Hoops, Amy V, PA-C  ibuprofen (ADVIL,MOTRIN) 200 MG tablet Take 200 mg by mouth every 6 (six) hours as needed.    [provider]  methocarbamol (ROBAXIN) 750 MG tablet Take 1 tablet (750 mg total) by mouth 3 (three) times daily. 07/11/18   Belinda Fisher, PA-C    Family History History reviewed. No pertinent family history.  Social History Social History   Tobacco Use  . Smoking status: Current Every Day Smoker    Packs/day: 1.00    Years: 19.00    Pack years: 19.00    Types: E-cigarettes, Cigarettes  . Smokeless tobacco: Never Used  Substance Use Topics  . Alcohol use: No  . Drug use: No     Allergies   Ultram [tramadol]; Wellbutrin [bupropion]; Gabapentin; Bupropion hcl; Sulfamethoxazole-trimethoprim; and Tramadol hcl   Review of Systems Review of Systems   Physical Exam Triage Vital Signs ED Triage Vitals  Enc Vitals Group     BP 09/22/18 1215 129/80     Pulse Rate 09/22/18 1215 78     Resp 09/22/18 1215 18     Temp 09/22/18 1215 98.5 F (36.9 C)     Temp Source 09/22/18 1215 Oral     SpO2 09/22/18 1215 99 %  Weight --      Height --      Head Circumference --      Peak Flow --      Pain Score 09/22/18 1213 7     Pain Loc --      Pain Edu? --      Excl. in GC? --    No data found.  Updated Vital Signs BP 129/80 (BP Location: Right Arm)   Pulse 78   Temp 98.5 F (36.9 C) (Oral)   Resp 18   SpO2 99%    Physical Exam  Constitutional: He is oriented to person, place, and time. He appears well-developed and well-nourished.  HENT:  Head: Normocephalic and atraumatic.  Right Ear: Tympanic membrane, external ear and ear canal normal.  Left Ear: Tympanic membrane, external ear and ear canal normal.  Nose: Nose normal. Right sinus exhibits no maxillary sinus tenderness and no frontal sinus tenderness. Left sinus exhibits no maxillary sinus tenderness and no frontal sinus tenderness.  Mouth/Throat: Uvula  is midline, oropharynx is clear and moist and mucous membranes are normal.  Eyes: Pupils are equal, round, and reactive to light. Conjunctivae are normal.  Neck: Normal range of motion.  Cardiovascular: Normal rate and regular rhythm.  Pulmonary/Chest: Effort normal and breath sounds normal.  Lymphadenopathy:    He has no cervical adenopathy.  Neurological: He is alert and oriented to person, place, and time.  Skin: Skin is warm and dry.  Vitals reviewed.    UC Treatments / Results  Labs (all labs ordered are listed, but only abnormal results are displayed) Labs Reviewed - No data to display  EKG None  Radiology No results found.  Procedures Procedures (including critical care time)  Medications Ordered in UC Medications - No data to display  Initial Impression / Assessment and Plan / UC Course  I have reviewed the triage vital signs and the nursing notes.  Pertinent labs & imaging results that were available during my care of the patient were reviewed by me and considered in my medical decision making (see chart for details).     Non toxic in appearance. Afebrile. Benign physical exam. History and physical consistent with viral illness.  Supportive cares recommended. If symptoms worsen or do not improve in the next week to return to be seen or to follow up with PCP.  Patient verbalized understanding and agreeable to plan.    Final Clinical Impressions(s) / UC Diagnoses   Final diagnoses:  Upper respiratory tract infection, unspecified type     Discharge Instructions     Push fluids to ensure adequate hydration and keep secretions thin.  Tylenol and/or ibuprofen as needed for pain or fevers.   Daily flonase to help with sinus symptoms.  Mucinex as an expectorant may also be helpful.  Heat application, warm shower to help loosen secretions, massage to low back.  If symptoms worsen or do not improve in the next week to return to be seen or to follow up with your  PCP.     ED Prescriptions    None     Controlled Substance Prescriptions [place] Controlled Substance Registry consulted? Not Applicable   Georgetta Haber, NP 09/22/18 1237

## 2018-09-22 NOTE — ED Triage Notes (Signed)
Symptoms started Monday.  Patient has had head congestion, significant headache, generalized aching.

## 2018-09-22 NOTE — Discharge Instructions (Signed)
Push fluids to ensure adequate hydration and keep secretions thin.  Tylenol and/or ibuprofen as needed for pain or fevers.   Daily flonase to help with sinus symptoms.  Mucinex as an expectorant may also be helpful.  Heat application, warm shower to help loosen secretions, massage to low back.  If symptoms worsen or do not improve in the next week to return to be seen or to follow up with your PCP.

## 2021-02-04 ENCOUNTER — Ambulatory Visit
Admission: RE | Admit: 2021-02-04 | Discharge: 2021-02-04 | Disposition: A | Discharge status: Home / Self Care | Payer: Medicaid Other | Source: Ambulatory Visit | Attending: Internal Medicine | Admitting: Internal Medicine

## 2021-02-04 ENCOUNTER — Other Ambulatory Visit: Payer: Self-pay

## 2021-02-04 VITALS — BP 158/90 | HR 101 | Temp 98.3°F | Resp 17

## 2021-02-04 DIAGNOSIS — J209 Acute bronchitis, unspecified: Secondary | ICD-10-CM | POA: Diagnosis not present

## 2021-02-04 MED ORDER — DOXYCYCLINE HYCLATE 100 MG PO CAPS: 100.0000 mg | ORAL_CAPSULE | Freq: Two times a day (BID) | ORAL | 0 refills | Status: DC

## 2021-02-04 MED ORDER — ALBUTEROL SULFATE HFA 108 (90 BASE) MCG/ACT IN AERS: 2.0000 | INHALATION_SPRAY | RESPIRATORY_TRACT | 0 refills | Status: DC | PRN

## 2021-02-04 NOTE — Discharge Instructions (Signed)
Work on quit smoking 

## 2021-02-04 NOTE — ED Triage Notes (Signed)
Patient states he has had chest congestion and fatigue x 1 week. Pt denies any other symptoms. Pt is aox4 and ambulatory.

## 2021-02-04 NOTE — ED Provider Notes (Signed)
[facility]    CSN: [id number] Arrival date & time: 02/04/21  1353      History   Chief Complaint Chief Complaint  Patient presents with  . Chest Congestion    X 1 week  . Fatigue    X 1 week    HPI Brent Reed is a 45 y.o. male. who presents with coughx 1 week. Had negative covid test yesterday. Denies fever.  He started with hoarseness initially. Yesterday at work he noticed he feels he has to take deep breahts and cant. He does not wear a mask, but shields instead. He is a smoker. Has been wheezing. Cough is productive with brown sputum. Has had sports induced asthma. Has felt very fatigued yesterday.  Has not had covid infection or the injections.    Past Medical History:  Diagnosis Date  . Allergy   . Back pain   . Chronic pain    back pain  . GERD (gastroesophageal reflux disease)    tums as needed  . H/O hiatal hernia   . Hypertension    10/23/16 denies now  . 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 Seizures (HCC)    previously on tegretol, thought to be secondary to Ultram on one occurance  . Seizures (HCC)    last 2-08  from medication    Patient Active Problem List   Diagnosis Date Noted  . HNP (herniated nucleus pulposus), lumbar 07/13/2014  . Allergic rhinitis 07/19/2011  . Knee pain, right 04/15/2011  . Hypertension 04/14/2011  . Chronic pain syndrome 01/13/2011  . CANNABIS ABUSE 05/29/2008  . TOBACCO ABUSE 01/18/2008  . UNSPECIFIED EPISODIC MOOD DISORDER 01/09/2008  . SEIZURE DISORDER 04/21/2007  . BACK PAIN, LUMBAR, WITH RADICULOPATHY 03/22/2007    Past Surgical History:  Procedure Laterality Date  . APPENDECTOMY      as teen  . BACK SURGERY  05/15/2003, 07/13/2014   x 2   . pyloric stenosis     66 weeks old     Home Medications    Prior to Admission medications   Medication Sig Start Date End Date Taking? Authorizing Provider  albuterol (VENTOLIN HFA) 108 (90 Base) MCG/ACT inhaler Inhale 2 puffs into  the lungs every 4 (four) hours as needed for wheezing or shortness of breath. 02/04/21  Yes Rodriguez-Southworth, Nettie Elm, PA-C  doxycycline (VIBRAMYCIN) 100 MG capsule Take 1 capsule (100 mg total) by mouth 2 (two) times daily. 02/04/21  Yes Rodriguez-Southworth, Nettie Elm, PA-C  ibuprofen (ADVIL,MOTRIN) 200 MG tablet Take 200 mg by mouth every 6 (six) hours as needed.   Yes [provider]    Family History History reviewed. No pertinent family history.  Social History Social History   Tobacco Use  . Smoking status: Current Every Day Smoker    Packs/day: 1.00    Years: 19.00    Pack years: 19.00    Types: E-cigarettes, Cigarettes  . Smokeless tobacco: Never Used  Vaping Use  . Vaping Use: Never used  Substance Use Topics  . Alcohol use: No  . Drug use: No     Allergies   Ultram [tramadol], Wellbutrin [bupropion], Gabapentin, Bupropion hcl, Sulfamethoxazole-trimethoprim, and Tramadol hcl   Review of Systems Review of Systems  Constitutional: Positive for appetite change. Negative for chills, diaphoresis, fatigue and fever.  HENT: Negative for congestion, ear discharge, ear pain, postnasal drip, rhinorrhea, sore throat and trouble swallowing.   Eyes: Negative for discharge.  Respiratory: Positive for cough and wheezing.  Negative for chest tightness and shortness of breath.   Gastrointestinal: Negative for diarrhea, nausea and vomiting.  Musculoskeletal: Negative for gait problem and myalgias.  Skin: Negative for rash.  Neurological: Negative for headaches.  Hematological: Negative for adenopathy.   Physical Exam Triage Vital Signs ED Triage Vitals  Enc Vitals Group     BP 02/04/21 1407 (!) 158/90     Pulse Rate 02/04/21 1407 (!) 101     Resp 02/04/21 1407 17     Temp 02/04/21 1407 98.3 F (36.8 C)     Temp Source 02/04/21 1407 Oral     SpO2 02/04/21 1407 96 %     Weight --      Height --      Head Circumference --      Peak Flow --      Pain Score 02/04/21  1409 0     Pain Loc --      Pain Edu? --      Excl. in GC? --    No data found.  Updated Vital Signs BP (!) 158/90 (BP Location: Right Arm)   Pulse (!) 101   Temp 98.3 F (36.8 C) (Oral)   Resp 17   SpO2 96%   Visual Acuity Right Eye Distance:   Left Eye Distance:   Bilateral Distance:    Right Eye Near:   Left Eye Near:    Bilateral Near:     Physical Exam Physical Exam Constitutional:      General: He is not in acute distress.    Appearance: He is not toxic-appearing.  HENT:     Head: Normocephalic.     Right Ear: Tympanic membrane, ear canal and external ear normal.     Left Ear: Ear canal and external ear normal.     Nose: Nose normal.     Mouth/Throat:     Mouth: Mucous membranes are moist.     Pharynx: Oropharynx is clear.  Eyes:     General: No scleral icterus.    Conjunctiva/sclera: Conjunctivae normal.  Cardiovascular:     Rate and Rhythm: Normal rate and regular rhythm.     Heart sounds: No murmur heard.   Pulmonary:     Effort: Pulmonary effort is normal. No respiratory distress.     Breath sounds: Wheezing present.     Comments: Has auditory wheezing Musculoskeletal:        General: Normal range of motion.     Cervical back: Neck supple.  Lymphadenopathy:     Cervical: No cervical adenopathy.  Skin:    General: Skin is warm and dry.     Findings: No rash.  Neurological:     Mental Status: He is alert and oriented to person, place, and time.     Gait: Gait normal.  Psychiatric:        Mood and Affect: Mood normal.        Behavior: Behavior normal.        Thought Content: Thought content normal.        Judgment: Judgment normal.     UC Treatments / Results  Labs (all labs ordered are listed, but only abnormal results are displayed) Labs Reviewed - No data to display  EKG   Radiology No results found.  Procedures Procedures (including critical care time)  Medications Ordered in UC Medications - No data to display  Initial  Impression / Assessment and Plan / UC Course  I have reviewed the triage vital signs and  the nursing notes. Has acute bronchitis and due to being a smoker I placed him on Doxy  and Albuterol inhaler as noted.  See instructions.  Final Clinical Impressions(s) / UC Diagnoses   Final diagnoses:  Acute bronchitis, unspecified organism     Discharge Instructions     Work on quit smoking     ED Prescriptions    Medication Sig Dispense Auth. Provider   albuterol (VENTOLIN HFA) 108 (90 Base) MCG/ACT inhaler Inhale 2 puffs into the lungs every 4 (four) hours as needed for wheezing or shortness of breath. 20 each Rodriguez-Southworth, Nettie Elm, PA-C   doxycycline (VIBRAMYCIN) 100 MG capsule Take 1 capsule (100 mg total) by mouth 2 (two) times daily. 20 capsule Rodriguez-Southworth, Nettie Elm, PA-C     PDMP not reviewed this encounter.   Garey Ham, PA-C 02/04/21 1451

## 2021-03-28 ENCOUNTER — Encounter (HOSPITAL_COMMUNITY): Payer: Self-pay | Admitting: Emergency Medicine

## 2021-03-28 ENCOUNTER — Emergency Department (HOSPITAL_COMMUNITY)
Admission: EM | Admit: 2021-03-28 | Discharge: 2021-03-28 | Disposition: A | Discharge status: Home / Self Care | Payer: Medicaid Other | Attending: Emergency Medicine | Admitting: Emergency Medicine

## 2021-03-28 DIAGNOSIS — W890XXA Exposure to welding light (arc), initial encounter: Secondary | ICD-10-CM | POA: Insufficient documentation

## 2021-03-28 DIAGNOSIS — F17210 Nicotine dependence, cigarettes, uncomplicated: Secondary | ICD-10-CM | POA: Insufficient documentation

## 2021-03-28 DIAGNOSIS — I10 Essential (primary) hypertension: Secondary | ICD-10-CM | POA: Insufficient documentation

## 2021-03-28 DIAGNOSIS — T2016XA Burn of first degree of forehead and cheek, initial encounter: Secondary | ICD-10-CM | POA: Diagnosis not present

## 2021-03-28 DIAGNOSIS — T310 Burns involving less than 10% of body surface: Secondary | ICD-10-CM | POA: Insufficient documentation

## 2021-03-28 DIAGNOSIS — H16133 Photokeratitis, bilateral: Secondary | ICD-10-CM | POA: Insufficient documentation

## 2021-03-28 DIAGNOSIS — X088XXA Exposure to other specified smoke, fire and flames, initial encounter: Secondary | ICD-10-CM | POA: Insufficient documentation

## 2021-03-28 DIAGNOSIS — T2006XA Burn of unspecified degree of forehead and cheek, initial encounter: Secondary | ICD-10-CM | POA: Diagnosis present

## 2021-03-28 MED ORDER — TETRACAINE HCL 0.5 % OP SOLN
1.0000 [drp] | Freq: Once | OPHTHALMIC | Status: AC
  Administered 2021-03-28: 1 [drp] via OPHTHALMIC
  Filled 2021-03-28: qty 4

## 2021-03-28 MED ORDER — FLUORESCEIN SODIUM 1 MG OP STRP
1.0000 | ORAL_STRIP | Freq: Once | OPHTHALMIC | Status: AC
  Administered 2021-03-28: 1 via OPHTHALMIC
  Filled 2021-03-28: qty 1

## 2021-03-28 MED ORDER — CIPROFLOXACIN HCL 0.3 % OP SOLN: 1.0000 [drp] | OPHTHALMIC | 0 refills | Status: DC

## 2021-03-28 MED ORDER — HYDROCODONE-ACETAMINOPHEN 5-325 MG PO TABS: 1.0000 | ORAL_TABLET | Freq: Four times a day (QID) | ORAL | 0 refills | Status: DC | PRN

## 2021-03-28 NOTE — ED Triage Notes (Signed)
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 Redness to forehead, cheeks and around his eyes.
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
## 2021-05-19 ENCOUNTER — Ambulatory Visit
Admission: RE | Admit: 2021-05-19 | Discharge: 2021-05-19 | Disposition: A | Discharge status: Home / Self Care | Payer: Medicaid Other | Source: Ambulatory Visit | Attending: Emergency Medicine | Admitting: Emergency Medicine

## 2021-05-19 ENCOUNTER — Other Ambulatory Visit: Payer: Self-pay

## 2021-05-19 ENCOUNTER — Ambulatory Visit
Admission: EM | Admit: 2021-05-19 | Discharge: 2021-05-19 | Discharge status: Absent without leave | Payer: Medicaid Other

## 2021-05-19 VITALS — BP 152/95 | HR 80 | Temp 98.6°F | Resp 18

## 2021-05-19 DIAGNOSIS — L240 Irritant contact dermatitis due to detergents: Secondary | ICD-10-CM

## 2021-05-19 MED ORDER — PREDNISONE 10 MG PO TABS: ORAL_TABLET | ORAL | 0 refills | Status: DC

## 2021-05-19 MED ORDER — HYDROXYZINE HCL 25 MG PO TABS: 25.0000 mg | ORAL_TABLET | Freq: Four times a day (QID) | ORAL | 0 refills | Status: AC | PRN

## 2021-05-19 NOTE — ED Triage Notes (Signed)
Pt here for itchy rash to bilateral arms x 4 days

## 2021-05-19 NOTE — ED Provider Notes (Signed)
HPI  SUBJECTIVE:  Brent Reed is a 45 y.o. male who presents with an intensely pruritic, erythematous burning rash where his clothes touch for 4 days.  States that it initially started on his arms, back, abdomen, but has spread to his face and hands where he wears a handkerchief and gloves.  Patient states that he started recently using fabric softener.  He switched to a different brand, but states that the rash continues to spread.  No new lotions, soaps, other detergents, foods, change in medications.  No known exposure to poison ivy, poison oak, sumac.  No crusting, fevers, blisters.  He reports urticaria when he scratches it.  He has pets in the house, but they do not have fleas.  No sensation of being bitten at night, blood on the bedclothes in the morning.  He has tried Benadryl with some improvement in symptoms and Tylenol.  Symptoms are worse with sweating or wearing clothing.  He has a past medical history of seizures, sensitive skin.  No history of diabetes, hypertension, chronic kidney disease.  ONG:EXBMWUX, Sharrie Rothman, MD   Past Medical History:  Diagnosis Date  . Allergy   . Back pain   . Chronic pain    back pain  . GERD (gastroesophageal reflux disease)    tums as needed  . H/O hiatal hernia   . Hypertension    10/23/16 denies now  . 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 Seizures (HCC)    previously on tegretol, thought to be secondary to Ultram on one occurance  . Seizures (HCC)    last 2-08  from medication    Past Surgical History:  Procedure Laterality Date  . APPENDECTOMY      as teen  . BACK SURGERY  05/15/2003, 07/13/2014   x 2   . pyloric stenosis     57 weeks old    History reviewed. No pertinent family history.  Social History   Tobacco Use  . Smoking status: Current Every Day Smoker    Packs/day: 1.00    Years: 19.00    Pack years: 19.00    Types: E-cigarettes, Cigarettes  . Smokeless tobacco: Never Used  Vaping Use  . Vaping Use:  Never used  Substance Use Topics  . Alcohol use: No  . Drug use: No    No current facility-administered medications for this encounter.  Current Outpatient Medications:  .  hydrOXYzine (ATARAX/VISTARIL) 25 MG tablet, Take 1 tablet (25 mg total) by mouth every 6 (six) hours as needed for up to 10 days for itching., Disp: 30 tablet, Rfl: 0 .  predniSONE (DELTASONE) 10 MG tablet, 6 tabs on day 1-2, 5 tabs on day 3-4, 4 tabs on day 5-6, 3 tabs on day 7-8, 2 tabs day 9-10, 1 tab day 11-12, Disp: 42 tablet, Rfl: 0 .  albuterol (VENTOLIN HFA) 108 (90 Base) MCG/ACT inhaler, Inhale 2 puffs into the lungs every 4 (four) hours as needed for wheezing or shortness of breath., Disp: 20 each, Rfl: 0 .  ibuprofen (ADVIL,MOTRIN) 200 MG tablet, Take 200 mg by mouth every 6 (six) hours as needed., Disp: , Rfl:   Allergies  Allergen Reactions  . Ultram [Tramadol] Other (See Comments)    seizures  . Wellbutrin [Bupropion] Other (See Comments)    seizures  . Gabapentin Other (See Comments)    Mood change- suicidal thought  . Bupropion Hcl     REACTION: Seizures  . Sulfamethoxazole-Trimethoprim  REACTION: rash  . Tramadol Hcl     REACTION: seizure     ROS  As noted in HPI.   Physical Exam  BP (!) 152/95 (BP Location: Right Arm)   Pulse 80   Temp 98.6 F (37 C) (Oral)   Resp 18   SpO2 98%   Constitutional: Well developed, well nourished, no acute distress Eyes:  EOMI, conjunctiva normal bilaterally HENT: Normocephalic, atraumatic,mucus membranes moist Respiratory: Normal inspiratory effort Cardiovascular: Normal rate GI: nondistended skin:  mildly tender well-circumscribed areas of erythema with some urticaria.  Rash is in the distribution where his clothes touch.  No rash over distal lower extremities.             Musculoskeletal: no deformities Neurologic: Alert & oriented x 3, no focal neuro deficits Psychiatric: Speech and behavior appropriate   ED  Course   Medications - No data to display  No orders of the defined types were placed in this encounter.   No results found for this or any previous visit (from the past 24 hour(s)). No results found.  ED Clinical Impression  1. Irritant contact dermatitis due to detergent      ED Assessment/Plan  Presentation consistent with contact dermatitis,  Patient states that he has not worn socks for several days, thus does not have a rash there.  Will send home with 12 days of prednisone, Claritin or Zyrtec, and if that does not work, then Atarax.  There is no evidence of infection at this time.  Follow-up with PMD as needed.  Discussed labs, imaging, MDM, treatment plan, and plan for follow-up with patient.patient agrees with plan.   Meds ordered this encounter  Medications  . hydrOXYzine (ATARAX/VISTARIL) 25 MG tablet    Sig: Take 1 tablet (25 mg total) by mouth every 6 (six) hours as needed for up to 10 days for itching.    Dispense:  30 tablet    Refill:  0  . predniSONE (DELTASONE) 10 MG tablet    Sig: 6 tabs on day 1-2, 5 tabs on day 3-4, 4 tabs on day 5-6, 3 tabs on day 7-8, 2 tabs day 9-10, 1 tab day 11-12    Dispense:  42 tablet    Refill:  0      *This clinic note was created using Scientist, clinical (histocompatibility and immunogenetics). Therefore, there may be occasional mistakes despite careful proofreading.  ?    Domenick Gong, MD 05/19/21 1904

## 2021-05-19 NOTE — Discharge Instructions (Addendum)
I would discontinue the fabric softener.  Finish prednisone, even if you feel better.  Take Claritin or Zyrtec, and if that does not work, then Atarax.

## 2021-06-04 ENCOUNTER — Ambulatory Visit
Admission: RE | Admit: 2021-06-04 | Discharge: 2021-06-04 | Disposition: A | Discharge status: Home / Self Care | Payer: Medicaid Other | Source: Ambulatory Visit | Attending: Student | Admitting: Student

## 2021-06-04 ENCOUNTER — Other Ambulatory Visit: Payer: Self-pay

## 2021-06-04 VITALS — BP 152/104 | HR 100 | Temp 98.6°F | Resp 16

## 2021-06-04 DIAGNOSIS — G8929 Other chronic pain: Secondary | ICD-10-CM

## 2021-06-04 DIAGNOSIS — Z981 Arthrodesis status: Secondary | ICD-10-CM

## 2021-06-04 DIAGNOSIS — M545 Low back pain, unspecified: Secondary | ICD-10-CM

## 2021-06-04 MED ORDER — TIZANIDINE HCL 2 MG PO TABS: 2.0000 mg | ORAL_TABLET | Freq: Four times a day (QID) | ORAL | 0 refills | Status: DC | PRN

## 2021-06-04 NOTE — ED Provider Notes (Signed)
[facility]    CSN: [id number] Arrival date & time: 06/04/21  1450      History   Chief Complaint No chief complaint on file.   HPI Brent Reed is a 45 y.o. male presenting with acute exacerbation of his chronic back pain.  Medical history chronic back pain, hypertension.  States patient was bending over at work 1 day ago, when he straightened up he experienced sharp right-sided back pain and muscle spasms, alone with his normal sciatica. States these are his normal back symptoms. Since then he has tried his normal routine of heat and ibuprofen with minimal improvement.  States he typically requires Vicodin and muscle relaxer and this gets him back on his feet.  Has not followed with his spine doctor in about 2 years as the pain has been well controlled on this regimen. Denies pain shooting down legs, denies numbness in arms/legs, denies weakness in arms/legs, denies saddle anesthesia, denies bowel/bladder incontinence.     HPI  Past Medical History:  Diagnosis Date  . Allergy   . Back pain   . Chronic pain    back pain  . GERD (gastroesophageal reflux disease)    tums as needed  . H/O hiatal hernia   . Hypertension    10/23/16 denies now  . 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 Seizures (HCC)    previously on tegretol, thought to be secondary to Ultram on one occurance  . Seizures (HCC)    last 2-08  from medication    Patient Active Problem List   Diagnosis Date Noted  . HNP (herniated nucleus pulposus), lumbar 07/13/2014  . Allergic rhinitis 07/19/2011  . Knee pain, right 04/15/2011  . Hypertension 04/14/2011  . Chronic pain syndrome 01/13/2011  . CANNABIS ABUSE 05/29/2008  . TOBACCO ABUSE 01/18/2008  . UNSPECIFIED EPISODIC MOOD DISORDER 01/09/2008  . SEIZURE DISORDER 04/21/2007  . BACK PAIN, LUMBAR, WITH RADICULOPATHY 03/22/2007    Past Surgical History:  Procedure Laterality Date  . APPENDECTOMY      as teen  . BACK SURGERY   05/15/2003, 07/13/2014   x 2   . pyloric stenosis     75 weeks old       Home Medications    Prior to Admission medications   Medication Sig Start Date End Date Taking? Authorizing Provider  tiZANidine (ZANAFLEX) 2 MG tablet Take 1 tablet (2 mg total) by mouth every 6 (six) hours as needed for muscle spasms. 06/04/21  Yes Rhys Martini, PA-C  albuterol (VENTOLIN HFA) 108 (90 Base) MCG/ACT inhaler Inhale 2 puffs into the lungs every 4 (four) hours as needed for wheezing or shortness of breath. 02/04/21   Rodriguez-Southworth, Nettie Elm, PA-C  ibuprofen (ADVIL,MOTRIN) 200 MG tablet Take 200 mg by mouth every 6 (six) hours as needed.    [provider]  predniSONE (DELTASONE) 10 MG tablet 6 tabs on day 1-2, 5 tabs on day 3-4, 4 tabs on day 5-6, 3 tabs on day 7-8, 2 tabs day 9-10, 1 tab day 11-12 05/19/21   Domenick Gong, MD    Family History History reviewed. No pertinent family history.  Social History Social History   Tobacco Use  . Smoking status: Current Every Day Smoker    Packs/day: 1.00    Years: 19.00    Pack years: 19.00    Types: E-cigarettes, Cigarettes  . Smokeless tobacco: Never Used  Vaping Use  . Vaping Use: Never used  Substance Use  Topics  . Alcohol use: No  . Drug use: No     Allergies   Ultram [tramadol], Wellbutrin [bupropion], Gabapentin, Bupropion hcl, Sulfamethoxazole-trimethoprim, and Tramadol hcl   Review of Systems Review of Systems  Constitutional: Negative for chills, fever and unexpected weight change.  Respiratory: Negative for chest tightness and shortness of breath.   Cardiovascular: Negative for chest pain and palpitations.  Gastrointestinal: Negative for abdominal pain, diarrhea, nausea and vomiting.  Genitourinary: Negative for decreased urine volume, difficulty urinating and frequency.  Musculoskeletal: Positive for back pain. Negative for arthralgias, gait problem, joint swelling, myalgias, neck pain and neck stiffness.  Skin:  Negative for wound.  Neurological: Negative for dizziness, tremors, seizures, syncope, facial asymmetry, speech difficulty, weakness, light-headedness, numbness and headaches.  All other systems reviewed and are negative.    Physical Exam Triage Vital Signs ED Triage Vitals  Enc Vitals Group     BP 06/04/21 1517 (!) 152/104     Pulse Rate 06/04/21 1517 100     Resp 06/04/21 1517 16     Temp 06/04/21 1517 98.6 F (37 C)     Temp Source 06/04/21 1517 Oral     SpO2 06/04/21 1517 99 %     Weight --      Height --      Head Circumference --      Peak Flow --      Pain Score 06/04/21 1515 7     Pain Loc --      Pain Edu? --      Excl. in GC? --    No data found.  Updated Vital Signs BP (!) 152/104 (BP Location: Right Arm)   Pulse 100   Temp 98.6 F (37 C) (Oral)   Resp 16   SpO2 99%   Visual Acuity Right Eye Distance:   Left Eye Distance:   Bilateral Distance:    Right Eye Near:   Left Eye Near:    Bilateral Near:     Physical Exam Vitals reviewed.  Constitutional:      General: He is not in acute distress.    Appearance: Normal appearance. He is not ill-appearing.  HENT:     Head: Normocephalic and atraumatic.  Cardiovascular:     Rate and Rhythm: Normal rate and regular rhythm.     Heart sounds: Normal heart sounds.  Pulmonary:     Effort: Pulmonary effort is normal.     Breath sounds: Normal breath sounds and air entry.  Abdominal:     Tenderness: There is no abdominal tenderness. There is no right CVA tenderness, left CVA tenderness, guarding or rebound.     Comments: No bowel or bladder incontinence.  Musculoskeletal:     Cervical back: Normal range of motion. No swelling, deformity, signs of trauma, rigidity, spasms, tenderness, bony tenderness or crepitus. No pain with movement.     Thoracic back: No swelling, deformity, signs of trauma, spasms, tenderness or bony tenderness. Normal range of motion. No scoliosis.     Lumbar back: Spasms and tenderness  present. No swelling, deformity, signs of trauma or bony tenderness. Normal range of motion. Positive right straight leg raise test. Negative left straight leg raise test. No scoliosis.     Comments: Right-sided lumbar paraspinous muscle tenderness to palpation.  Pain elicited with flexion lumbar spine.  Positive right straight leg raise.  Gait intact but patient ambulating with pain.  Strength out of 5 in upper lower extremities, sensation intact.  No saddle anesthesia.  No spinous deformity or step-off.  Absolutely no other injury, deformity, tenderness, ecchymosis, abrasion.  Neurological:     General: No focal deficit present.     Mental Status: He is alert.     Cranial Nerves: No cranial nerve deficit.  Psychiatric:        Mood and Affect: Mood normal.        Behavior: Behavior normal.        Thought Content: Thought content normal.        Judgment: Judgment normal.      UC Treatments / Results  Labs (all labs ordered are listed, but only abnormal results are displayed) Labs Reviewed - No data to display  EKG   Radiology No results found.  Procedures Procedures (including critical care time)  Medications Ordered in UC Medications - No data to display  Initial Impression / Assessment and Plan / UC Course  I have reviewed the triage vital signs and the nursing notes.  Pertinent labs & imaging results that were available during my care of the patient were reviewed by me and considered in my medical decision making (see chart for details).     This patient is a 46 year old male presenting with acute exacerbation of his chronic back pain.  He does have history of lumbar fusion 2015. No red flag symptoms.  Requesting vicodin to treat his pain. He was last prescribed this two months ago. Discussed that I do not treat chronic pain with narcotics, rec he f/u with his neurosurgeon/spine doctor. He declines prednisone. zanaflex sent. Continue ibuprofen, heat/ice, ROM  exercises.  Red flag symptoms and ED return precautions discussed.  Final Clinical Impressions(s) / UC Diagnoses   Final diagnoses:  Acute exacerbation of chronic low back pain  History of lumbar spinal fusion     Discharge Instructions     -Start the muscle relaxer-Zanaflex (tizanidine), up to 3 times daily for muscle spasms and pain.  This can make you drowsy, so take at bedtime or when you do not need to drive or operate machinery. -For additional relief, Take Tylenol 1000 mg 3 times daily, and ibuprofen 800 mg 3 times daily with food.  You can take these together, or alternate every 3-4 hours. -Follow-up with your spine doctor if symptoms persist.  -Seek additional immediate medical attention if your symptoms get worse, like the worst pain of your life, new numbness of your inner thighs, urinary retention, constipation.    ED Prescriptions    Medication Sig Dispense Auth. Provider   tiZANidine (ZANAFLEX) 2 MG tablet Take 1 tablet (2 mg total) by mouth every 6 (six) hours as needed for muscle spasms. 21 tablet Rhys Martini, PA-C     I have reviewed the PDMP during this encounter.   Rhys Martini, PA-C 06/04/21 [phone]

## 2021-06-04 NOTE — Discharge Instructions (Addendum)
-  Start the muscle relaxer-Zanaflex (tizanidine), up to 3 times daily for muscle spasms and pain.  This can make you drowsy, so take at bedtime or when you do not need to drive or operate machinery. -For additional relief, Take Tylenol 1000 mg 3 times daily, and ibuprofen 800 mg 3 times daily with food.  You can take these together, or alternate every 3-4 hours. -Follow-up with your spine doctor if symptoms persist.  -Seek additional immediate medical attention if your symptoms get worse, like the worst pain of your life, new numbness of your inner thighs, urinary retention, constipation.

## 2021-06-04 NOTE — ED Triage Notes (Signed)
Pt said hx of back pain with surgeries. Pt said he was bent over at work for some time and now his back is locked up. pt went home and did heat and ibuprofen with rest. Still feels tight and down his sciatica.

## 2021-06-24 ENCOUNTER — Other Ambulatory Visit: Payer: Self-pay

## 2021-06-24 ENCOUNTER — Ambulatory Visit
Admission: RE | Admit: 2021-06-24 | Discharge: 2021-06-24 | Disposition: A | Discharge status: Home / Self Care | Payer: Medicaid Other | Source: Ambulatory Visit | Attending: Family Medicine | Admitting: Family Medicine

## 2021-06-24 VITALS — BP 136/82 | HR 78 | Temp 98.6°F | Resp 16

## 2021-06-24 DIAGNOSIS — R112 Nausea with vomiting, unspecified: Secondary | ICD-10-CM

## 2021-06-24 DIAGNOSIS — R531 Weakness: Secondary | ICD-10-CM

## 2021-06-24 NOTE — ED Provider Notes (Signed)
[facility]    CSN: [id number] Arrival date & time: 06/24/21  1151      History   Chief Complaint Chief Complaint  Patient presents with   Abdominal Pain   Emesis   Appointment    1200    HPI Brent Reed is a 45 y.o. male.   Pt here for n/v yesterday after eating , has not had any emesis today but feels tired. He is asking for a work note. Feels better today. No abd pain, no chest pain, no fever. Has not taken anything pta.    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   Seizures (HCC)    previously on tegretol, thought to be secondary to Ultram on one occurance   Seizures (HCC)    last 2-08  from medication    Patient Active Problem List   Diagnosis Date Noted   HNP (herniated nucleus pulposus), lumbar 07/13/2014   Allergic rhinitis 07/19/2011   Knee pain, right 04/15/2011   Hypertension 04/14/2011   Chronic pain syndrome 01/13/2011   CANNABIS ABUSE 05/29/2008   TOBACCO ABUSE 01/18/2008   UNSPECIFIED EPISODIC MOOD DISORDER 01/09/2008   SEIZURE DISORDER 04/21/2007   BACK PAIN, LUMBAR, WITH RADICULOPATHY 03/22/2007    Past Surgical History:  Procedure Laterality Date   APPENDECTOMY      as teen   BACK SURGERY  05/15/2003, 07/13/2014   x 2    pyloric stenosis     101 weeks old       Home Medications    Prior to Admission medications   Medication Sig Start Date End Date Taking? Authorizing Provider  albuterol (VENTOLIN HFA) 108 (90 Base) MCG/ACT inhaler Inhale 2 puffs into the lungs every 4 (four) hours as needed for wheezing or shortness of breath. 02/04/21   Rodriguez-Southworth, Nettie Elm, PA-C  ibuprofen (ADVIL,MOTRIN) 200 MG tablet Take 200 mg by mouth every 6 (six) hours as needed.    [provider]   predniSONE (DELTASONE) 10 MG tablet 6 tabs on day 1-2, 5 tabs on day 3-4, 4 tabs on day 5-6, 3 tabs on day 7-8, 2 tabs day 9-10, 1 tab day 11-12 05/19/21   Domenick Gong, MD  tiZANidine (ZANAFLEX) 2 MG tablet Take 1 tablet (2 mg total) by mouth every 6 (six) hours as needed for muscle spasms. 06/04/21   Rhys Martini, PA-C    Family History History reviewed. No pertinent family history.  Social History Social History   Tobacco Use   Smoking status: Every Day    Packs/day: 1.00    Years: 19.00    Pack years: 19.00    Types: E-cigarettes, Cigarettes   Smokeless tobacco: Never  Vaping Use   Vaping Use: Never used  Substance Use Topics   Alcohol use: No   Drug use: No     Allergies   Ultram [tramadol], Wellbutrin [bupropion], Gabapentin, Bupropion hcl, Sulfamethoxazole-trimethoprim, and Tramadol hcl   Review of Systems Review of Systems  Constitutional: Negative.   Respiratory: Negative.    Cardiovascular: Negative.   Gastrointestinal:  Positive for nausea.  Neurological:  Positive for weakness.    Physical Exam Triage Vital Signs ED Triage Vitals  Enc Vitals Group     BP 06/24/21 1232 136/82     Pulse Rate 06/24/21 1232 78     Resp 06/24/21 1232 16     Temp 06/24/21 1232 98.6 F (37 C)     Temp Source 06/24/21 1232 Oral     SpO2 06/24/21 1232 97 %     Weight --      Height --      Head Circumference --      Peak Flow --      Pain Score 06/24/21 1231 0     Pain Loc --      Pain Edu? --      Excl. in GC? --    No data found.  Updated Vital Signs BP 136/82 (BP Location: Left Arm)   Pulse 78   Temp 98.6 F (37 C) (Oral)   Resp 16   SpO2 97%   Visual Acuity     Physical Exam Constitutional:      Appearance: He is well-developed.  Cardiovascular:     Rate and Rhythm: Normal rate.  Pulmonary:     Effort: Pulmonary effort is normal.  Abdominal:     General: Abdomen is flat. Bowel sounds are normal.     Palpations: Abdomen is soft.      Tenderness: There is no abdominal tenderness. There is no right CVA tenderness or left CVA tenderness.  Neurological:     Mental Status: He is alert.     UC Treatments / Results  Labs (all labs ordered are listed, but only abnormal results are displayed) Labs Reviewed - No data to display  EKG   Radiology No results found.  Procedures Procedures (including critical care time)  Medications Ordered in UC Medications - No data to display  Initial Impression / Assessment and Plan / UC Course  I have reviewed the triage vital signs and the nursing notes.  Pertinent labs & imaging results that were available during my care of the patient were reviewed by me and considered in my medical decision making (see chart for details).     Stay hydrated well  Eat a bland diet    Final Clinical Impressions(s) / UC Diagnoses   Final diagnoses:  Nausea and vomiting, intractability of vomiting not specified, unspecified vomiting type  Weakness   Discharge Instructions   None    ED Prescriptions   None    PDMP not reviewed this encounter.   Coralyn Mark, NP 06/24/21 1258

## 2021-06-24 NOTE — ED Triage Notes (Signed)
Pt c/o of abdominal pain and fatigue since Sunday. He states he had vomiting episodes that resolved yesterday. Has a hx of IBS and GERD.   Denies fever.

## 2023-03-16 ENCOUNTER — Ambulatory Visit
Admission: EM | Admit: 2023-03-16 | Discharge: 2023-03-16 | Disposition: A | Discharge status: Home / Self Care | Payer: Medicaid Other | Attending: Internal Medicine | Admitting: Internal Medicine

## 2023-03-16 ENCOUNTER — Encounter: Payer: Self-pay | Admitting: Emergency Medicine

## 2023-03-16 ENCOUNTER — Ambulatory Visit (INDEPENDENT_AMBULATORY_CARE_PROVIDER_SITE_OTHER): Payer: Medicaid Other

## 2023-03-16 ENCOUNTER — Other Ambulatory Visit: Payer: Self-pay

## 2023-03-16 DIAGNOSIS — M25511 Pain in right shoulder: Secondary | ICD-10-CM | POA: Diagnosis not present

## 2023-03-16 DIAGNOSIS — T148XXA Other injury of unspecified body region, initial encounter: Secondary | ICD-10-CM

## 2023-03-16 MED ORDER — METHOCARBAMOL 500 MG PO TABS: 500.0000 mg | ORAL_TABLET | Freq: Two times a day (BID) | ORAL | 0 refills | Status: DC | PRN

## 2023-03-16 NOTE — Discharge Instructions (Signed)
It appears that you may have a muscle strain.  I have prescribed a muscle relaxer to take as needed.  Please be advised that this medication can make you drowsy so do not drive or drink alcohol while taking it.  Follow-up with orthopedist if symptoms persist or worsen.

## 2023-03-16 NOTE — ED Triage Notes (Signed)
Pt here for right shoulder pain x 4 days; denies obvious injury

## 2023-03-16 NOTE — ED Provider Notes (Signed)
[facility]    CSN: [id number] Arrival date & time: 03/16/23  [id number]      History   Chief Complaint Chief Complaint  Patient presents with   Shoulder Pain    HPI Brent Reed is a 47 y.o. male.   Patient presents with right shoulder pain that has been present for about 4 days.  Reports that pain radiates from his neck down to his shoulder.  Denies any obvious injury but reports that he does a lot of heavy lifting on a daily basis at his job.  Denies history of chronic shoulder pain.  Reports that he does have a history of lumbar surgery and occasionally has spasms of his upper back due to this.  Although, reports that this feels different as it has been more persistent.  Denies numbness or tingling. Has full range of motion of arm but movement exacerbates pain.  Patient has not taken any medications for symptoms.   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   Seizures ([place])    previously on tegretol, thought to be secondary to Ultram on one occurance   Seizures ([place])    last 2-08  from medication    Patient Active Problem List   Diagnosis Date Noted   HNP (herniated nucleus pulposus), lumbar 07/13/2014   Allergic rhinitis 07/19/2011   Knee pain, right 04/15/2011   Hypertension 04/14/2011   Chronic pain syndrome 01/13/2011   CANNABIS ABUSE 05/29/2008   TOBACCO ABUSE 01/18/2008   UNSPECIFIED EPISODIC MOOD DISORDER 01/09/2008   SEIZURE DISORDER 04/21/2007   BACK PAIN, LUMBAR, WITH RADICULOPATHY 03/22/2007    Past Surgical History:  Procedure Laterality Date   APPENDECTOMY      as teen   BACK SURGERY  05/15/2003, 07/13/2014   x 2    pyloric stenosis     59 weeks old       Home Medications    Prior to  Admission medications   Medication Sig Start Date End Date Taking? Authorizing Provider  methocarbamol (ROBAXIN) 500 MG tablet Take 1 tablet (500 mg total) by mouth 2 (two) times daily as needed for muscle spasms. 03/16/23  Yes Jayron Maqueda, Michele Rockers, FNP  albuterol (VENTOLIN HFA) 108 (90 Base) MCG/ACT inhaler Inhale 2 puffs into the lungs every 4 (four) hours as needed for wheezing or shortness of breath. 02/04/21   Rodriguez-Southworth, Sunday Spillers, PA-C  ibuprofen (ADVIL,MOTRIN) 200 MG tablet Take 200 mg by mouth every 6 (six) hours as needed.    [provider]  predniSONE (DELTASONE) 10 MG tablet 6 tabs on day 1-2, 5 tabs on day 3-4, 4 tabs on day 5-6, 3 tabs on day 7-8, 2 tabs day 9-10, 1 tab day 11-12 05/19/21   Melynda Ripple, MD    Family History History reviewed. No pertinent family history.  Social History Social History   Tobacco Use   Smoking status: Every Day    Packs/day: 1.00    Years: 19.00    Additional pack years: 0.00    Total pack years: 19.00    Types: E-cigarettes, Cigarettes   Smokeless tobacco: Never  Vaping Use   Vaping Use: Never used  Substance Use Topics   Alcohol use: No   Drug use: No     Allergies   Ultram [tramadol], Wellbutrin [bupropion], Gabapentin, Bupropion hcl, Sulfamethoxazole-trimethoprim, and Tramadol hcl   Review of Systems Review of Systems Per HPI  Physical Exam Triage Vital Signs ED Triage Vitals  Enc Vitals Group     BP 03/16/23 1011 (!) 160/92     Pulse Rate 03/16/23 1011 63     Resp 03/16/23 1011 18     Temp 03/16/23 1011 98.2 F (36.8 C)     Temp Source 03/16/23 1011 Oral     SpO2 03/16/23 1011 95 %     Weight --      Height --      Head Circumference --      Peak Flow --      Pain Score 03/16/23 1012 6     Pain Loc --      Pain Edu? --      Excl. in [place]? --    No data found.  Updated Vital Signs BP (!) 160/92 (BP Location: Left Arm)   Pulse 63   Temp 98.2 F (36.8 C) (Oral)   Resp 18   SpO2 95%   Visual  Acuity Right Eye Distance:   Left Eye Distance:   Bilateral Distance:    Right Eye Near:   Left Eye Near:    Bilateral Near:     Physical Exam Constitutional:      General: He is not in acute distress.    Appearance: Normal appearance. He is not toxic-appearing or diaphoretic.  HENT:     Head: Normocephalic and atraumatic.  Eyes:     Extraocular Movements: Extraocular movements intact.     Conjunctiva/sclera: Conjunctivae normal.  Pulmonary:     Effort: Pulmonary effort is normal.  Musculoskeletal:     Comments: Tenderness to palpation to right lateral neck that extends into right upper back/trapezius muscle.  Patient also has tenderness to palpation to lateral right shoulder.  No crepitus, swelling, discoloration noted.  Patient has pain with abduction of arm at about 45 degrees.  Grip strength is 5/5.  Neurovascular intact.  Neurological:     General: No focal deficit present.     Mental Status: He is alert and oriented to person, place, and time. Mental status is at baseline.  Psychiatric:        Mood and Affect: Mood normal.        Behavior: Behavior normal.        Thought Content: Thought content normal.        Judgment: Judgment normal.      UC Treatments / Results  Labs (all labs ordered are listed, but only abnormal results are displayed) Labs Reviewed - No data to display  EKG   Radiology DG Shoulder Right  Result Date: 03/16/2023 CLINICAL DATA:  Acute right shoulder pain without known injury. EXAM: RIGHT SHOULDER - 2+ VIEW COMPARISON:  None Available. FINDINGS: There is no evidence of fracture or dislocation. Moderate degenerative changes seen involving the right acromioclavicular joint. Soft tissues are unremarkable. 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 No acute abnormality seen. Electronically Signed   By: Marijo Conception M.D.   On: 03/16/2023 10:50    Procedures Procedures (including critical care  time)  Medications Ordered in UC Medications - No data to display  Initial Impression / Assessment and Plan / UC Course  I have reviewed the triage vital signs and the  nursing notes.  Pertinent labs & imaging results that were available during my care of the patient were reviewed by me and considered in my medical decision making (see chart for details).     Right shoulder x-ray showing moderate degenerative joint disease at the right [facility] joint but unsure if this is etiology of patient's pain as physical exam acute is consistent with possible muscle strain.  Will treat with muscle relaxer.  Patient does have history of seizures approximately 20 years ago but has taken muscle relaxer since and tolerated well.  Advised patient that muscle relaxer can make him drowsy and do not drive or drink alcohol with taking the medication.  Advised supportive care as well.  Advised strict follow-up with orthopedist at provided contact information if symptoms persist or worsen.  Patient verbalized understanding and was agreeable with plan. Final Clinical Impressions(s) / UC Diagnoses   Final diagnoses:  Acute pain of right shoulder  Muscle strain     Discharge Instructions      It appears that you may have a muscle strain.  I have prescribed a muscle relaxer to take as needed.  Please be advised that this medication can make you drowsy so do not drive or drink alcohol while taking it.  Follow-up with orthopedist if symptoms persist or worsen.     ED Prescriptions     Medication Sig Dispense Auth. Provider   methocarbamol (ROBAXIN) 500 MG tablet Take 1 tablet (500 mg total) by mouth 2 (two) times daily as needed for muscle spasms. 20 tablet Hostetter, Layton E, [place]      I have reviewed the PDMP during this encounter.   Teodora Medici, [place] 03/16/23 1114

## 2023-06-03 ENCOUNTER — Ambulatory Visit
Admission: EM | Admit: 2023-06-03 | Discharge: 2023-06-03 | Disposition: A | Discharge status: Home / Self Care | Payer: Medicaid Other

## 2023-06-03 ENCOUNTER — Other Ambulatory Visit: Payer: Self-pay

## 2023-06-03 ENCOUNTER — Encounter: Payer: Self-pay | Admitting: *Deleted

## 2023-06-03 DIAGNOSIS — R21 Rash and other nonspecific skin eruption: Secondary | ICD-10-CM | POA: Diagnosis not present

## 2023-06-03 MED ORDER — CEPHALEXIN 500 MG PO CAPS: 500.0000 mg | ORAL_CAPSULE | Freq: Three times a day (TID) | ORAL | 0 refills | Status: AC

## 2023-06-03 MED ORDER — HYDROXYZINE HCL 25 MG PO TABS: 25.0000 mg | ORAL_TABLET | Freq: Four times a day (QID) | ORAL | 0 refills | Status: AC | PRN

## 2023-06-03 MED ORDER — ONDANSETRON 4 MG PO TBDP
4.0000 mg | ORAL_TABLET | Freq: Once | ORAL | Status: AC
  Administered 2023-06-03: 4 mg via ORAL

## 2023-06-03 NOTE — ED Triage Notes (Addendum)
C/O starting with generalized pruritic, painful rash 3 days ago with progressive worsening; rash noted to bilat palms as well. Went to PCP Tuesday - gave hydrocortisone cream and started course of prednisone. States rash is much worse now; states anywhere he scratches his skin swells. Denies any oral swelling or lesions. Started with fever up to 100.4 this AM, and had one episode vomiting this AM. Denies nausea at present.  STates had Covid 3 wks ago and has had a cough since. States recently started Prilosec and dicyclomine 2 wks ago, but has held dicyclomine since onset of rash. Had Tylenol and Benadryl this AM.

## 2023-06-03 NOTE — Discharge Instructions (Signed)
Basic blood work is pending.  We will call if it is abnormal.  I have prescribed you an antibiotic to take for infection as well as medication to take as needed for itching.  Please be advised that the medication for itching can make you drowsy.  Follow-up with family medicine doctor or emergency department if symptoms significantly worsen.

## 2023-06-03 NOTE — ED Provider Notes (Signed)
[facility]    CSN: [id number] Arrival date & time: 06/03/23  4098      History   Chief Complaint Chief Complaint  Patient presents with   Fever   Rash    HPI Brent Reed is a 47 y.o. male.   Patient presents with rash that started a few days ago.  Patient was seen by PCP on 6/4 and prescribed oral prednisone steroid taper as well as topical steroid.  He reports that symptoms have not improved and have seemed to worsen.  He reports that rash started off as being itchy but is now quite painful.  He also reports that it is very warm in certain areas.  Developed a fever last night of 100.4.  He also developed some nausea with vomiting as well.  Reports that he was started on dicyclomine and rash seemed to subsequently started after this.  Denies any exposure to poison ivy or poison oak.  Denies any recent tick bites or insect bites.  Denies any known sick contacts.   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   Seizures (HCC)    previously on tegretol, thought to be secondary to Ultram on one occurance   Seizures (HCC)    last 2-08  from medication    Patient Active Problem List   Diagnosis Date Noted   HNP (herniated nucleus pulposus), lumbar 07/13/2014   Allergic rhinitis 07/19/2011   Knee pain, right 04/15/2011   Hypertension 04/14/2011   Chronic pain syndrome 01/13/2011   CANNABIS ABUSE 05/29/2008   TOBACCO ABUSE 01/18/2008   UNSPECIFIED EPISODIC MOOD DISORDER 01/09/2008   SEIZURE DISORDER 04/21/2007   BACK PAIN, LUMBAR, WITH RADICULOPATHY 03/22/2007    Past Surgical History:  Procedure Laterality Date   APPENDECTOMY      as teen   BACK SURGERY  05/15/2003, 07/13/2014   x 2    pyloric stenosis     61 weeks old        Home Medications    Prior to Admission medications   Medication Sig Start Date End Date Taking? Authorizing Provider  cephALEXin (KEFLEX) 500 MG capsule Take 1 capsule (500 mg total) by mouth 3 (three) times daily for 5 days. 06/03/23 06/08/23 Yes Viviann Broyles, Acie Fredrickson, FNP  DICYCLOMINE HCL PO Take by mouth.   Yes [provider]  hydrOXYzine (ATARAX) 25 MG tablet Take 1 tablet (25 mg total) by mouth every 6 (six) hours as needed for itching. 06/03/23  Yes Maranatha Grossi, Rolly Salter E, FNP  Omeprazole Magnesium (PRILOSEC PO) Take by mouth.   Yes [provider]  predniSONE (DELTASONE) 10 MG tablet 6 tabs on day 1-2, 5 tabs on day 3-4, 4 tabs on day 5-6, 3 tabs on day 7-8, 2 tabs day 9-10, 1 tab day 11-12 05/19/21  Yes Domenick Gong, MD  albuterol (VENTOLIN HFA) 108 (90 Base) MCG/ACT inhaler Inhale 2 puffs into the lungs every 4 (four) hours as needed for wheezing or shortness of breath. 02/04/21   Rodriguez-Southworth, Nettie Elm, PA-C  ibuprofen (ADVIL,MOTRIN) 200 MG tablet Take 200 mg by mouth every 6 (six) hours as needed.    [provider]  methocarbamol (ROBAXIN) 500 MG tablet Take 1 tablet (500 mg total) by mouth 2 (two) times daily as needed for muscle spasms. 03/16/23   Gustavus Bryant, FNP    Family History History reviewed. No pertinent family history.  Social History Social History   Tobacco Use   Smoking status: Every Day    Packs/day: 1.00    Years: 19.00    Additional pack years: 0.00    Total pack years: 19.00    Types: Cigarettes   Smokeless tobacco: Never  Vaping Use   Vaping Use: Never used  Substance Use Topics   Alcohol use: No   Drug use: No     Allergies   Ultram [tramadol], Wellbutrin [bupropion], Gabapentin, Bupropion hcl, Sulfamethoxazole-trimethoprim, and Tramadol hcl   Review of Systems Review of Systems Per HPI  Physical Exam Triage Vital Signs ED Triage Vitals  Enc Vitals Group     BP 06/03/23 1034 (!) 135/95     Pulse Rate  06/03/23 1034 83     Resp 06/03/23 1034 14     Temp 06/03/23 1034 98 F (36.7 C)     Temp Source 06/03/23 1034 Oral     SpO2 06/03/23 1034 95 %     Weight --      Height --      Head Circumference --      Peak Flow --      Pain Score 06/03/23 1035 7     Pain Loc --      Pain Edu? --      Excl. in GC? --    No data found.  Updated Vital Signs BP (!) 135/95   Pulse 83   Temp 98 F (36.7 C) (Oral)   Resp 14   SpO2 95%   Visual Acuity Right Eye Distance:   Left Eye Distance:   Bilateral Distance:    Right Eye Near:   Left Eye Near:    Bilateral Near:     Physical Exam Constitutional:      General: He is not in acute distress.    Appearance: Normal appearance. He is not toxic-appearing or diaphoretic.  HENT:     Head: Normocephalic and atraumatic.  Eyes:     Extraocular Movements: Extraocular movements intact.     Conjunctiva/sclera: Conjunctivae normal.  Pulmonary:     Effort: Pulmonary effort is normal.  Skin:    Comments: Patient has erythematous, raised rash present to right flank area that seems consistent with hives/contact dermatitis.  Similar rash to bilateral shins.  Patient's hands have a vesicular type rash that is flat and erythematous scattered throughout.  Similar rash to left forearm with diffuse erythema and warmth.  Neurological:     General: No focal deficit present.     Mental Status: He is alert and oriented to person, place, and time. Mental status is at baseline.  Psychiatric:        Mood and Affect: Mood normal.        Behavior: Behavior normal.        Thought Content: Thought content normal.        Judgment: Judgment normal.      UC Treatments / Results  Labs (all labs ordered are listed, but only abnormal results are displayed) Labs Reviewed  CBC  BASIC METABOLIC PANEL    EKG   Radiology No results found.  Procedures Procedures (including critical care time)  Medications Ordered in UC Medications  ondansetron  (ZOFRAN-ODT) disintegrating tablet 4 mg (  4 mg Oral Given 06/03/23 1129)    Initial Impression / Assessment and Plan / UC Course  I have reviewed the triage vital signs and the nursing notes.  Pertinent labs & imaging results that were available during my care of the patient were reviewed by me and considered in my medical decision making (see chart for details).     I am not sure exact etiology of rash so this patient's exam and clinical course was discussed with supervising physician, Dr. Leonides Grills.  Patient advised to continue and complete steroid.  Advised patient to use calamine lotion topically.  Will treat with cephalexin antibiotic given concern of cellulitis related to rash in various areas on physical exam.  Also prescribed hydroxyzine to take as needed for itching.  Advised patient this can make him drowsy. he voiced understanding of this.  He denies that he takes any other sedating medications so that should be safe.  Will obtain BMP and CBC as well to rule out any worrisome etiologies.  Patient advised to follow-up with family medicine doctor or urgent care if symptoms persist.  He was given strict ER precautions.  No signs of anaphylaxis on exam.  Patient verbalized understanding and was agreeable with plan. Final Clinical Impressions(s) / UC Diagnoses   Final diagnoses:  Rash and nonspecific skin eruption     Discharge Instructions      Basic blood work is pending.  We will call if it is abnormal.  I have prescribed you an antibiotic to take for infection as well as medication to take as needed for itching.  Please be advised that the medication for itching can make you drowsy.  Follow-up with family medicine doctor or emergency department if symptoms significantly worsen.    ED Prescriptions     Medication Sig Dispense Auth. Provider   cephALEXin (KEFLEX) 500 MG capsule Take 1 capsule (500 mg total) by mouth 3 (three) times daily for 5 days. 15 capsule [place], [place], [place]    hydrOXYzine (ATARAX) 25 MG tablet Take 1 tablet (25 mg total) by mouth every 6 (six) hours as needed for itching. 12 tablet [place], Acie Fredrickson, [place]      PDMP not reviewed this encounter.   Gustavus Bryant, [place] 06/03/23 1143

## 2023-06-04 LAB — CBC
Hematocrit: 50.1 % (ref 37.5–51.0)
Hemoglobin: 17 g/dL (ref 13.0–17.7)
MCH: 31.5 pg (ref 26.6–33.0)
MCHC: 33.9 g/dL (ref 31.5–35.7)
MCV: 93 fL (ref 79–97)
Platelets: 491 10*3/uL — ABNORMAL HIGH (ref 150–450)
RBC: 5.4 x10E6/uL (ref 4.14–5.80)
RDW: 13.2 % (ref 11.6–15.4)
WBC: 27.5 10*3/uL (ref 3.4–10.8)

## 2023-06-04 LAB — BASIC METABOLIC PANEL
BUN/Creatinine Ratio: 17 (ref 9–20)
BUN: 18 mg/dL (ref 6–24)
CO2: 21 mmol/L (ref 20–29)
Calcium: 9.4 mg/dL (ref 8.7–10.2)
Chloride: 104 mmol/L (ref 96–106)
Creatinine, Ser: 1.05 mg/dL (ref 0.76–1.27)
Glucose: 112 mg/dL — ABNORMAL HIGH (ref 70–99)
Potassium: 4 mmol/L (ref 3.5–5.2)
Sodium: 142 mmol/L (ref 134–144)
eGFR: 90 mL/min/{1.73_m2} (ref >59)

## 2023-10-08 ENCOUNTER — Encounter: Payer: Self-pay | Admitting: Physician Assistant

## 2023-10-08 ENCOUNTER — Ambulatory Visit: Payer: Medicaid Other | Admitting: Physician Assistant

## 2023-10-08 VITALS — BP 130/80 | HR 76 | Ht 70.0 in | Wt 180.1 lb

## 2023-10-08 DIAGNOSIS — K219 Gastro-esophageal reflux disease without esophagitis: Secondary | ICD-10-CM | POA: Diagnosis not present

## 2023-10-08 DIAGNOSIS — R197 Diarrhea, unspecified: Secondary | ICD-10-CM

## 2023-10-08 DIAGNOSIS — R112 Nausea with vomiting, unspecified: Secondary | ICD-10-CM | POA: Diagnosis not present

## 2023-10-08 DIAGNOSIS — Z1211 Encounter for screening for malignant neoplasm of colon: Secondary | ICD-10-CM

## 2023-10-08 MED ORDER — NA SULFATE-K SULFATE-MG SULF 17.5-3.13-1.6 GM/177ML PO SOLN: 1.0000 | Freq: Once | ORAL | 0 refills | Status: AC

## 2023-10-08 NOTE — Progress Notes (Signed)
I agree with the assessment and plan as outlined by Ms. Lemmon. 

## 2023-10-08 NOTE — Progress Notes (Signed)
Chief Complaint: GERD, nausea and vomiting  HPI:    Brent Reed is a 47 year old male with a past medical history as listed below including reflux and pyloric stenosis as a child as well as seizures, who was referred to me by Macy Mis, MD for a complaint of GERD.      06/03/2023 CBC with a WBC of 27.5 and platelets elevated at 491 otherwise normal.  BMP with a glucose of 112 and otherwise normal.    07/07/2023 CBC with a WBC of 11.5.    Today, patient presents to clinic and tells me that back in the late 90s and early 2000's he was evaluated for various GI issues.  There was a thought that he may have Crohn's.  He ended up having an endoscopy and colonoscopy and was told this was all normal.  They were not really sure what was wrong with him then.  Tells me he still has occasional bad days and will have diarrhea but that is not really why he is here.    Discusses that he has reflux symptoms recently started on Omeprazole 40 mg daily which works well for him.  He still has ongoing issues with nausea and vomiting though.  Tells me about 6 months ago this was occurring about 1-2 times a month and was always on a Monday morning when he would wake up to go to work.  Describes that he would have diarrhea and then had vomiting of undigested food.  This has calmed slightly since June and has only occurred 6 times since then, the Omeprazole seems to be helping.  He has also changed his diet and lifestyle which also seems to help.  Tells me the last episode was about 3 weeks ago.  He wonders if something is going on with his history of pyloric stenosis as a child, given that it is undigested food.  Denies any alcohol or marijuana use.    Denies fever, chills, weight loss, blood in stool or symptoms that awaken him from sleep.  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 20 each 0   DICYCLOMINE HCL PO Take by mouth.     hydrOXYzine (ATARAX) 25 MG tablet Take 1 tablet (25 mg total) by mouth every 6 (six) hours as needed for itching. 12 tablet 0   ibuprofen (ADVIL,MOTRIN) 200 MG tablet Take 200 mg by mouth every 6 (six) hours as needed.     methocarbamol (ROBAXIN) 500 MG tablet Take 1 tablet (500 mg total) by  mouth 2 (two) times daily as needed for muscle spasms. 20 tablet 0   Omeprazole Magnesium (PRILOSEC PO) Take by mouth.     predniSONE (DELTASONE) 10 MG tablet 6 tabs on day 1-2, 5 tabs on day 3-4, 4 tabs on day 5-6, 3 tabs on day 7-8, 2 tabs day 9-10, 1 tab day 11-12 42 tablet 0   No current facility-administered medications for this visit.    Allergies as of 10/08/2023 - Review Complete 06/03/2023  Allergen Reaction Noted   Ultram [tramadol] Other (See Comments) 07/13/2014   Wellbutrin [bupropion] Other (See Comments) 07/13/2014   Gabapentin Other (See Comments) 10/12/2016   Bupropion hcl  12/03/2009   Sulfamethoxazole-trimethoprim  05/22/2009   Tramadol hcl  12/03/2009    No family history on file.  Social History   Socioeconomic History   Marital status: Divorced    Spouse name: Not on file   Number of children: 3   Years of education: 10   Highest education level: Not on file  Occupational History    Comment: [employer]   Tobacco Use   Smoking status: Every Day    Current packs/day: 1.00    Average packs/day: 1 pack/day for 19.0 years (19.0 ttl pk-yrs)    Types: Cigarettes   Smokeless tobacco: Never  Vaping Use   Vaping status: Never Used  Substance and Sexual Activity   Alcohol use: No   Drug use: No   Sexual activity: Yes  Other Topics Concern   Not on file  Social History Narrative   ** Merged History Encounter **   Lives with 3 children   Caffeine - Mtn Dew all day       Social Determinants of Health   Financial Resource Strain: Medium Risk (05/20/2023)   Received from [facility], [facility]   Overall Financial Resource Strain (CARDIA)    Difficulty of Paying Living Expenses: Somewhat hard  Food Insecurity: No Food Insecurity (05/20/2023)   Received from [facility], [facility]   Hunger Vital Sign    Worried About Running Out of Food in the Last Year: Never true    Ran Out of Food in the Last Year: Never true  Transportation Needs: No Transportation Needs (05/20/2023)   Received [facility], [facility]   PRAPARE - Transportation    Lack of Transportation (Medical): No    Lack of Transportation (Non-Medical): No  Physical Activity: Sufficiently Active (05/20/2023)   Received from [facility], [facility]   Exercise Vital Sign    Days of Exercise per Week: 7 days    Minutes of Exercise per Session: 110 min  Stress: Stress Concern Present (05/20/2023)   Received from [facility], [facility] - Occupational Stress Questionnaire    Feeling of Stress : To some extent  Social Connections: Somewhat Isolated (05/20/2023)   Received from [facility], [facility]   Social Network    How would you rate your social network (family, work, friends)?: Restricted participation with some degree of social isolation  Intimate Partner Violence: Not At Risk (05/20/2023)   Received from [facility], [facility]   HITS    Over  the last 12 months how often did your partner physically hurt you?: 1    Over the last 12 months how often did your partner insult you or talk down to you?: 1    Over the last 12 months how often did your partner threaten you with physical  harm?: 1    Over the last 12 months how often did your partner scream or curse at you?: 1    Review of Systems:    Constitutional: No weight loss, fever or chills Skin: No rash  Cardiovascular: No chest pain Respiratory: No SOB Gastrointestinal: See HPI and otherwise negative Genitourinary: No dysuria  Neurological: No headache, dizziness or syncope Musculoskeletal: No new muscle or joint pain Hematologic: No bleeding  Psychiatric: No history of depression or anxiety   Physical Exam:  Vital signs: BP 130/80   Pulse 76   Ht 5\' 10"  (1.778 m)   Wt 180 lb 2 oz (81.7 kg)   BMI 25.85 kg/m    Constitutional:   Pleasant Caucasian male appears to be in NAD, Well developed, Well nourished, alert and cooperative Head:  Normocephalic and atraumatic. Eyes:   PEERL, EOMI. No icterus. Conjunctiva pink. Ears:  Normal auditory acuity. Neck:  Supple Throat: Oral cavity and pharynx without inflammation, swelling or lesion.  Respiratory: Respirations even and unlabored. Lungs clear to auscultation bilaterally.   No wheezes, crackles, or rhonchi.  Cardiovascular: Normal S1, S2. No MRG. Regular rate and rhythm. No peripheral edema, cyanosis or pallor.  Gastrointestinal:  Soft, nondistended, nontender. No rebound or guarding. Normal bowel sounds. No appreciable masses or hepatomegaly. Rectal:  Not performed.  Msk:  Symmetrical without gross deformities. Without edema, no deformity or joint abnormality.  Neurologic:  Alert and  oriented x4;  grossly normal neurologically.  Skin:   Dry and intact without significant lesions or rashes. Psychiatric: Demonstrates good judgement and reason without abnormal affect or behaviors.  RELEVANT LABS AND IMAGING: CBC     Component Value Date/Time   WBC 27.5 (HH) 06/03/2023 1139   WBC 10.8 (H) 07/13/2014 0932   RBC 5.40 06/03/2023 1139   RBC 4.85 07/13/2014 0932   HGB 17.0 06/03/2023 1139   HCT 50.1 06/03/2023 1139   PLT 491 (H) 06/03/2023 1139   MCV 93 06/03/2023 1139   MCH 31.5 06/03/2023 1139   MCH 32.2 07/13/2014 0932   MCHC 33.9 06/03/2023 1139   MCHC 34.6 07/13/2014 0932   RDW 13.2 06/03/2023 1139   LYMPHSABS 1.7 04/18/2008 2023   MONOABS 1.6 (H) 04/18/2008 2023   EOSABS 0.0 04/18/2008 2023   BASOSABS 0.0 04/18/2008 2023    CMP     Component Value Date/Time   NA 142 06/03/2023 1139   K 4.0 06/03/2023 1139   CL 104 06/03/2023 1139   CO2 21 06/03/2023 1139   GLUCOSE 112 (H) 06/03/2023 1139   GLUCOSE 91 07/13/2014 0932   BUN 18 06/03/2023 1139   CREATININE 1.05 06/03/2023 1139   CREATININE 1.00 04/14/2011 0953   CALCIUM 9.4 06/03/2023 1139   PROT 7.4 04/14/2011 0953   ALBUMIN 4.9 04/14/2011 0953   AST 19 04/14/2011 0953   ALT 23 04/14/2011 0953   ALKPHOS 55 04/14/2011 0953   BILITOT 0.6 04/14/2011 0953   GFRNONAA >90 07/13/2014 0932   GFRAA >90 07/13/2014 0932    Assessment: 1.  Nausea and vomiting: On Monday mornings prior to going to work with diarrhea nausea and vomiting, better with Omeprazole; consider element of gastritis +/- gastroparesis +/- functional dyspepsia 2.  GERD: Better with Omeprazole 40 mg daily over the past couple of months 3.  Screening for colorectal cancer: Patient will be 47 in November and will be due for colon cancer screening  Plan: 1.  Scheduled patient for screening colonoscopy and a diagnostic EGD in the [facility] with  Dr. Leonides Schanz.  Did provide the patient a detailed list of risks for the procedures and he agrees to proceed. Patient is appropriate for endoscopic procedure(s) in the ambulatory ([facility]) setting.  2.  Discussed that his symptoms sound very functional as they always occur on a Monday morning when he is getting up for work.  They have improved  some with Omeprazole so there could be an element of gastritis +/- gastroparesis. 3.  If EGD normal would recommend a gastric emptying study. 4.  Continue Omeprazole 40 mg daily for now. 5.  Patient to follow in clinic per recommendations after time of procedure.  Hyacinth Meeker, PA-C [facility] 10/08/2023, 2:34 PM  Cc: Macy Mis, MD

## 2023-10-08 NOTE — Patient Instructions (Signed)
Continue omeprazole.   You have been scheduled for an endoscopy and colonoscopy. Please follow the written instructions given to you at your visit today.  Please pick up your prep supplies at the pharmacy within the next 1-3 days.  If you use inhalers (even only as needed), please bring them with you on the day of your procedure.  DO NOT TAKE 7 DAYS PRIOR TO TEST- Trulicity (dulaglutide) Ozempic, Wegovy (semaglutide) Mounjaro (tirzepatide) Bydureon Bcise (exanatide extended release)  DO NOT TAKE 1 DAY PRIOR TO YOUR TEST Rybelsus (semaglutide) Adlyxin (lixisenatide) Victoza (liraglutide) Byetta (exanatide) _________________________________________________________________________  _______________________________________________________  If your blood pressure at your visit was 140/90 or greater, please contact your primary care physician to follow up on this.  _______________________________________________________  If you are age 48 or older, your body mass index should be between 23-30. Your Body mass index is 25.85 kg/m. If this is out of the aforementioned range listed, please consider follow up with your Primary Care Provider.  If you are age 76 or younger, your body mass index should be between 19-25. Your Body mass index is 25.85 kg/m. If this is out of the aformentioned range listed, please consider follow up with your Primary Care Provider.   ________________________________________________________  The [facility] providers would like to encourage you to use [facility] to communicate with providers for non-urgent requests or questions.  Due to long hold times on the telephone, sending your provider a message by [facility] may be a faster and more efficient way to get a response.  Please allow 48 business hours for a response.  Please remember that this is for non-urgent requests.  _______________________________________________________

## 2023-10-20 ENCOUNTER — Ambulatory Visit: Payer: Medicaid Other

## 2023-10-20 ENCOUNTER — Ambulatory Visit
Admission: EM | Admit: 2023-10-20 | Discharge: 2023-10-20 | Disposition: A | Discharge status: Home / Self Care | Payer: Medicaid Other | Attending: Internal Medicine | Admitting: Internal Medicine

## 2023-10-20 DIAGNOSIS — M5441 Lumbago with sciatica, right side: Secondary | ICD-10-CM

## 2023-10-20 DIAGNOSIS — M25551 Pain in right hip: Secondary | ICD-10-CM | POA: Diagnosis not present

## 2023-10-20 MED ORDER — PREDNISONE 20 MG PO TABS: 40.0000 mg | ORAL_TABLET | Freq: Every day | ORAL | 0 refills | Status: AC

## 2023-10-20 MED ORDER — METHOCARBAMOL 500 MG PO TABS: 500.0000 mg | ORAL_TABLET | Freq: Two times a day (BID) | ORAL | 0 refills | Status: DC | PRN

## 2023-10-20 NOTE — ED Triage Notes (Signed)
Patient presents with right hip joint pain and lower back pain x day 4. Treated with heating pad, exercise, Ibuprofen and Tylenol without relief.

## 2023-10-20 NOTE — Discharge Instructions (Signed)
I will call if x-ray results are abnormal.  I have prescribed you prednisone and Robaxin to take for your pain and inflammation.  Follow-up if symptoms persist or worsen.

## 2023-10-20 NOTE — ED Provider Notes (Signed)
[facility]    CSN: [id number] Arrival date & time: 10/20/23  0907      History   Chief Complaint No chief complaint on file.   HPI Brent Reed is a 47 y.o. male.   Patient presents with right lower back pain that radiates into his right lateral hip that started about 4 days ago.  Patient reports that he has been taking Tylenol for pain.  Patient states that he has a history of lower back pain with sciatica.  He states this feels similar but he has never had hip pain that is this significant with it previously.  It does not radiate all the way down his leg and he denies numbness or tingling.  Denies urinary frequency, urinary or bowel incontinence, saddle anesthesia.  Denies any injury to the area.  Patient states that he has a history of 2 different lumbar surgeries.  Reports that one of them was in early 2000's and the last 1 was in 2015.  He is not currently followed by spinal specialty.     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   Seizures (HCC)    previously on tegretol, thought to be secondary to Ultram on one occurance   Seizures (HCC)    last 2-08  from medication    Patient Active Problem List   Diagnosis Date Noted   HNP (herniated nucleus pulposus), lumbar 07/13/2014   Allergic rhinitis 07/19/2011   Knee pain, right 04/15/2011   Hypertension 04/14/2011   Chronic pain syndrome 01/13/2011   CANNABIS ABUSE 05/29/2008   TOBACCO ABUSE 01/18/2008   UNSPECIFIED EPISODIC MOOD DISORDER 01/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 Authorizing Provider  methocarbamol (ROBAXIN) 500 MG tablet Take 1 tablet (500 mg total) by mouth 2 (two) times daily as needed for muscle spasms. 10/20/23  Yes Vick Filter, Rolly Salter E, FNP  predniSONE (DELTASONE) 20 MG tablet Take 2 tablets (40 mg total) by mouth daily for 5 days. 10/20/23 10/25/23 Yes Sirus Labrie, Rolly Salter E, FNP  Acetaminophen (TYLENOL PO) Take by mouth as needed.    [provider]  albuterol (VENTOLIN HFA) 108 (90 Base) MCG/ACT inhaler Inhale 2 puffs into the lungs every 4 (four) hours as needed for wheezing or shortness of breath. 02/04/21   Rodriguez-Southworth, Nettie Elm, PA-C  hydrOXYzine (ATARAX) 25 MG tablet Take 1 tablet (25 mg total) by mouth every 6 (six) hours as needed for itching. 06/03/23   Gustavus Bryant, FNP  ibuprofen (ADVIL,MOTRIN) 200 MG tablet Take 200 mg by mouth every 6 (six) hours as needed.    [provider]  omeprazole (PRILOSEC) 40 MG capsule Take 40 mg by mouth daily.  [provider]    Family History Family History  Problem Relation Age of Onset   Diabetes Maternal Grandmother    Heart disease Maternal Grandmother    Colon cancer Neg Hx    Esophageal cancer Neg Hx     Social History Social History   Tobacco Use   Smoking status: Every Day    Current packs/day: 1.00    Average packs/day: 1 pack/day for 19.0 years (19.0 ttl pk-yrs)    Types: Cigarettes   Smokeless tobacco: Never  Vaping Use   Vaping status: Never Used  Substance Use Topics   Alcohol use: No   Drug use: No     Allergies   Ultram [tramadol], Wellbutrin [bupropion], Gabapentin, Bupropion hcl, Sulfamethoxazole-trimethoprim, and Tramadol hcl   Review of Systems Review of Systems Per HPI  Physical Exam Triage Vital Signs ED Triage Vitals  Encounter Vitals  Group     BP 10/20/23 1126 (!) 146/89     Systolic BP Percentile --      Diastolic BP Percentile --      Pulse Rate 10/20/23 1126 64     Resp 10/20/23 1126 16     Temp 10/20/23 1126 98 F (36.7 C)     Temp Source 10/20/23 1126 Oral     SpO2 10/20/23 1126 98 %     Weight 10/20/23 1124 180 lb (81.6 kg)     Height 10/20/23 1124 5\' 10"  (1.778 m)     Head Circumference --      Peak Flow --      Pain Score 10/20/23 1124 7     Pain Loc --      Pain Education --      Exclude from Growth Chart --    No data found.  Updated Vital Signs BP (!) 146/89 (BP Location: Right Arm)   Pulse 64   Temp 98 F (36.7 C) (Oral)   Resp 16   Ht 5\' 10"  (1.778 m)   Wt 180 lb (81.6 kg)   SpO2 98%   BMI 25.83 kg/m   Visual Acuity Right Eye Distance:   Left Eye Distance:   Bilateral Distance:    Right Eye Near:   Left Eye Near:    Bilateral Near:     Physical Exam Constitutional:      General: He is not in acute distress.    Appearance: Normal appearance. He is not toxic-appearing or diaphoretic.  HENT:     Head: Normocephalic and atraumatic.  Eyes:     Extraocular Movements: Extraocular movements intact.     Conjunctiva/sclera: Conjunctivae normal.  Pulmonary:     Effort: Pulmonary effort is normal.  Musculoskeletal:     Comments: Patient has tenderness to palpation to right lower lumbar region.  Tenderness seems to extend to the right lateral hip as he has tenderness to palpation to this area as well.  No obvious swelling or discoloration noted.  Patient is able to bear weight and ambulate.  Appears to be neurovascularly intact.  Neurological:     General: No focal deficit present.     Mental Status: He is alert and oriented to person, place, and time. Mental status is at baseline.     Deep Tendon Reflexes: Reflexes are normal and symmetric.  Psychiatric:        Mood and Affect: Mood normal.        Behavior: Behavior normal.        Thought Content: Thought content normal.  Judgment: Judgment normal.      UC Treatments / Results  Labs (all labs ordered are listed, but only abnormal results are displayed) Labs Reviewed - No data to display  EKG   Radiology DG Hip Unilat With Pelvis 2-3 Views Right  Result Date: 10/20/2023 CLINICAL DATA:  Right hip and low back pain EXAM: DG HIP (WITH OR WITHOUT PELVIS) 2-3V RIGHT COMPARISON:  None FINDINGS: Post instrumented fusion L4-S1. No fracture or dislocation. Marginal spurring from bilateral acetabula. Bony pelvis intact. IMPRESSION: 1. No acute findings. 2. Bilateral hip osteoarthritis. Electronically Signed   By: Corlis Leak M.D.   On: 10/20/2023 15:17    Procedures Procedures (including critical care time)  Medications Ordered in UC Medications - No data to display  Initial Impression / Assessment and Plan / UC Course  I have reviewed the triage vital signs and the nursing notes.  Pertinent labs & imaging results that were available during my care of the patient were reviewed by me and considered in my medical decision making (see chart for details).     X-ray of right hip completed given patient reporting it was more significant than previous.  This was negative for any acute bony abnormality but did show some osteoarthritis which was discussed with patient over phone call.  Suspect osteoarthritis flareup versus sciatica versus lumbar strain.  Will treat with prednisone and muscle relaxer.  Patient has taken both of these medications previously and tolerated well.  Reminded patient muscle relaxer can make him drowsy and do not drive or drink alcohol with taking it.  He previously took prednisone a few months ago, and there are no obvious contraindications to prednisone noted in patient's history.  Advised him to take with food to avoid stomach upset.  Advised to follow-up with orthopedist if pain persist or worsens.  Patient verbalized understanding and was agreeable with plan. Final Clinical Impressions(s) /  UC Diagnoses   Final diagnoses:  None     Discharge Instructions      I will call if x-ray results are abnormal.  I have prescribed you prednisone and Robaxin to take for your pain and inflammation.  Follow-up if symptoms persist or worsen.    ED Prescriptions     Medication Sig Dispense Auth. Provider   predniSONE (DELTASONE) 20 MG tablet Take 2 tablets (40 mg total) by mouth daily for 5 days. 10 tablet [place], [place], [place]   methocarbamol (ROBAXIN) 500 MG tablet Take 1 tablet (500 mg total) by mouth 2 (two) times daily as needed for muscle spasms. 20 tablet [place], [place]ie [place], [place]      PDMP not reviewed this encounter.   Gustavus Bryant, [place] 10/20/23 [phone]

## 2023-11-17 ENCOUNTER — Encounter: Payer: Medicaid Other | Admitting: Internal Medicine

## 2023-12-13 ENCOUNTER — Ambulatory Visit
Admission: RE | Admit: 2023-12-13 | Discharge: 2023-12-13 | Disposition: A | Discharge status: Home / Self Care | Payer: Medicaid Other | Source: Ambulatory Visit

## 2023-12-13 ENCOUNTER — Other Ambulatory Visit: Payer: Self-pay

## 2023-12-13 VITALS — BP 155/82 | HR 74 | Temp 98.1°F | Resp 18

## 2023-12-13 DIAGNOSIS — M25512 Pain in left shoulder: Secondary | ICD-10-CM | POA: Diagnosis not present

## 2023-12-13 MED ORDER — PREDNISONE 20 MG PO TABS: 40.0000 mg | ORAL_TABLET | Freq: Every day | ORAL | 0 refills | Status: AC

## 2023-12-13 NOTE — ED Provider Notes (Signed)
[facility]    CSN: [id number] Arrival date & time: 12/13/23  1346      History   Chief Complaint Chief Complaint  Patient presents with   Shoulder Pain    Entered by patient    HPI Brent Reed is a 47 y.o. male.   Patient here today for evaluation of shoulder pain that started about 6 months ago.  He reports that he woke this morning with worsened pain in his left shoulder he thinks from lying on the side.  He states that he can move his shoulder but has some pain with certain movements specifically above shoulder level.  He denies any new trauma or injury.  He has not had any numbness or tingling.  He denies any chest pain, shortness of breath, nausea, vomiting.  The history is provided by the patient.  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   Seizures (HCC)    previously on tegretol, thought to be secondary to Ultram on one occurance   Seizures (HCC)    last 2-08  from medication    Patient Active Problem List   Diagnosis Date Noted   HNP (herniated nucleus pulposus), lumbar 07/13/2014   Allergic rhinitis 07/19/2011   Knee pain, right 04/15/2011   Hypertension 04/14/2011   Chronic pain syndrome 01/13/2011   CANNABIS ABUSE 05/29/2008   TOBACCO ABUSE 01/18/2008   UNSPECIFIED EPISODIC MOOD DISORDER 01/09/2008   SEIZURE DISORDER 04/21/2007   BACK PAIN, LUMBAR, WITH RADICULOPATHY 03/22/2007    Past Surgical History:  Procedure Laterality Date   APPENDECTOMY      as teen   BACK SURGERY  05/15/2003, 07/13/2014   x 2    COLONOSCOPY WITH ESOPHAGOGASTRODUODENOSCOPY (EGD)     Last done in [facility]   pyloric stenosis     57 weeks old       Home Medications    Prior to  Admission medications   Medication Sig Start Date End Date Taking? Authorizing Provider  acetaminophen (TYLENOL) 500 MG tablet Take by mouth.   Yes [provider]  albuterol (VENTOLIN HFA) 108 (90 Base) MCG/ACT inhaler Inhale 2 puffs into the lungs every 4 (four) hours as needed for wheezing or shortness of breath. 02/04/21  Yes Rodriguez-Southworth, Nettie Elm, PA-C  albuterol (VENTOLIN HFA) 108 (90 Base) MCG/ACT inhaler Inhale into the lungs. 06/14/23  Yes [provider]  hydrOXYzine (ATARAX) 25 MG tablet Take by mouth. 06/03/23  Yes [provider]  hydrOXYzine (VISTARIL) 25 MG capsule Take by mouth. 09/28/19  Yes [provider]  ibuprofen (ADVIL,MOTRIN) 200 MG tablet Take 200 mg by mouth every 6 (six) hours as needed.   Yes [provider]  omeprazole (PRILOSEC) 40 MG capsule Take 40 mg by mouth daily.   Yes [provider]  predniSONE (DELTASONE) 20 MG tablet Take 2 tablets (40 mg total) by mouth daily with breakfast for 5 days. 12/13/23 12/18/23 Yes Tomi Bamberger, PA-C  Acetaminophen (TYLENOL PO) Take by mouth as needed.    [provider]  fluticasone (FLONASE) 50 MCG/ACT nasal spray Place into the nose. 03/15/18  [provider]  hydrOXYzine (ATARAX) 25 MG tablet Take 1 tablet (25 mg total) by mouth every 6 (six) hours as needed for itching. 06/03/23   Gustavus Bryant, FNP  methocarbamol (ROBAXIN) 500 MG tablet Take 1 tablet (500 mg total) by mouth 2 (two) times daily as needed for muscle spasms. 10/20/23   Gustavus Bryant, FNP  triamcinolone cream (KENALOG) 0.5 % Apply topically. Patient not taking: Reported on 12/13/2023 06/01/23 05/31/24  [provider]    Family History Family History  Problem Relation Age of Onset   Diabetes Maternal Grandmother    Heart disease Maternal Grandmother    Colon cancer Neg Hx    Esophageal cancer Neg Hx     Social History Social History   Tobacco Use   Smoking status: Every  Day    Current packs/day: 1.00    Average packs/day: 1 pack/day for 19.0 years (19.0 ttl pk-yrs)    Types: Cigarettes   Smokeless tobacco: Never  Vaping Use   Vaping status: Never Used  Substance Use Topics   Alcohol use: No   Drug use: No     Allergies   Bupropion, Ultram [tramadol], Gabapentin, Bupropion hcl, Sulfamethoxazole-trimethoprim, and Tramadol hcl   Review of Systems Review of Systems  Constitutional:  Negative for chills and fever.  Eyes:  Negative for discharge and redness.  Respiratory:  Negative for shortness of breath.   Musculoskeletal:  Positive for arthralgias. Negative for joint swelling.  Neurological:  Negative for numbness.     Physical Exam Triage Vital Signs ED Triage Vitals  Encounter Vitals Group     BP 12/13/23 1450 (!) 155/82     Systolic BP Percentile --      Diastolic BP Percentile --      Pulse Rate 12/13/23 1450 74     Resp 12/13/23 1450 18     Temp 12/13/23 1450 98.1 F (36.7 C)     Temp Source 12/13/23 1450 Oral     SpO2 12/13/23 1450 95 %     Weight --      Height --      Head Circumference --      Peak Flow --      Pain Score 12/13/23 1452 5     Pain Loc --      Pain Education --      Exclude from Growth Chart --    No data found.  Updated Vital Signs BP (!) 155/82 (BP Location: Right Arm)   Pulse 74   Temp 98.1 F (36.7 C) (Oral)   Resp 18   SpO2 95%   Visual Acuity Right Eye Distance:   Left Eye Distance:   Bilateral Distance:    Right Eye Near:   Left Eye Near:    Bilateral Near:     Physical Exam Vitals and nursing note reviewed.  Constitutional:      General: He is not in acute distress.    Appearance: Normal appearance. He is not ill-appearing.  HENT:     Head: Normocephalic and atraumatic.  Eyes:     Conjunctiva/sclera: Conjunctivae normal.  Cardiovascular:     Rate and Rhythm: Normal rate.  Pulmonary:     Effort: Pulmonary effort is normal. No respiratory distress.  Musculoskeletal:      Comments: Full range of motion of right shoulder, decreased range of motion of left shoulder above shoulder height.  Tenderness to palpation noted to left posterior shoulder diffusely  Skin:    Capillary  Refill: Normal cap refill to left fingers Neurological:     Mental Status: He is alert.     Comments: Gross sensation intact to left distal fingers  Psychiatric:        Mood and Affect: Mood normal.        Behavior: Behavior normal.        Thought Content: Thought content normal.      UC Treatments / Results  Labs (all labs ordered are listed, but only abnormal results are displayed) Labs Reviewed - No data to display  EKG   Radiology No results found.  Procedures Procedures (including critical care time)  Medications Ordered in UC Medications - No data to display  Initial Impression / Assessment and Plan / UC Course  I have reviewed the triage vital signs and the nursing notes.  Pertinent labs & imaging results that were available during my care of the patient were reviewed by me and considered in my medical decision making (see chart for details).    Will trial steroid burst for suspected inflammatory cause of pain and recommended follow-up with Ortho as soon as possible.  Patient is agreeable with plan.  Final Clinical Impressions(s) / UC Diagnoses   Final diagnoses:  Acute pain of left shoulder   Discharge Instructions   None    ED Prescriptions     Medication Sig Dispense Auth. Provider   predniSONE (DELTASONE) 20 MG tablet Take 2 tablets (40 mg total) by mouth daily with breakfast for 5 days. 10 tablet Tomi Bamberger, PA-C      PDMP not reviewed this encounter.   Tomi Bamberger, PA-C 12/13/23 1529

## 2023-12-13 NOTE — ED Triage Notes (Signed)
Pt reports L shoulder pain x6 months. Pt more worried because he woke up this morning at 2am with severe pain from laying on the area. Pt unable to lift L arm fully above head x1 day. No specific trauma/injury to area prior to pain.

## 2024-01-12 ENCOUNTER — Ambulatory Visit
Admission: RE | Admit: 2024-01-12 | Discharge: 2024-01-12 | Disposition: A | Discharge status: Home / Self Care | Payer: Medicaid Other | Source: Ambulatory Visit | Attending: Physician Assistant | Admitting: Physician Assistant

## 2024-01-12 ENCOUNTER — Encounter: Payer: Self-pay | Admitting: Emergency Medicine

## 2024-01-12 ENCOUNTER — Ambulatory Visit
Admission: RE | Admit: 2024-01-12 | Discharge: 2024-01-12 | Disposition: A | Discharge status: Home / Self Care | Payer: Self-pay | Source: Ambulatory Visit

## 2024-01-12 ENCOUNTER — Ambulatory Visit (INDEPENDENT_AMBULATORY_CARE_PROVIDER_SITE_OTHER): Payer: Medicaid Other

## 2024-01-12 DIAGNOSIS — R051 Acute cough: Secondary | ICD-10-CM

## 2024-01-12 DIAGNOSIS — J440 Chronic obstructive pulmonary disease with acute lower respiratory infection: Secondary | ICD-10-CM | POA: Diagnosis not present

## 2024-01-12 DIAGNOSIS — J209 Acute bronchitis, unspecified: Secondary | ICD-10-CM | POA: Diagnosis not present

## 2024-01-12 MED ORDER — AEROCHAMBER PLUS FLO-VU LARGE MISC
1.0000 | Freq: Once | Status: AC
  Administered 2024-01-12: 1

## 2024-01-12 MED ORDER — ALBUTEROL SULFATE HFA 108 (90 BASE) MCG/ACT IN AERS
2.0000 | INHALATION_SPRAY | Freq: Once | RESPIRATORY_TRACT | Status: AC
  Administered 2024-01-12: 2 via RESPIRATORY_TRACT

## 2024-01-12 MED ORDER — DOXYCYCLINE HYCLATE 100 MG PO CAPS: 100.0000 mg | ORAL_CAPSULE | Freq: Two times a day (BID) | ORAL | 0 refills | Status: DC

## 2024-01-12 MED ORDER — METHYLPREDNISOLONE 4 MG PO TBPK
ORAL_TABLET | ORAL | 0 refills | Status: DC
Start: 2024-01-12 — End: 2024-01-28

## 2024-01-12 NOTE — ED Triage Notes (Signed)
 Patient c/o cough, sinus pressure, right ear pain and hurts to take a deep breath x 1 week.  Patient has been sick for over a week thought he was getting better, then sx's reappeared.  Patient has taken Flonase , Sudafed, Tylenol , Ibuprofen  and Benadryl.

## 2024-01-12 NOTE — ED Provider Notes (Signed)
 [facility]    CSN: [id number] Arrival date & time: 01/12/24  0853      History   Chief Complaint Chief Complaint  Patient presents with   URI    HPI Brent Reed is a 48 y.o. male.   Patient presents today with a 2-week history of URI symptoms.  He reports that initially he had mild congestion and subjective fever but this has resolved.  Over the past week his symptoms have recurred and worsened and he now has sinus pressure, cough, shortness of breath, chest tightness.  He does report a history of asthma when he was younger and does have an albuterol  inhaler; has not been using since symptoms began.  He has been taking Sudafed, Flonase , Tylenol , ibuprofen , Benadryl without improvement of symptoms.  He does smoke but denies formal diagnosis of COPD.  Denies any recent antibiotics or steroids.  Reports that several household sick contacts with similar symptoms.  He has not had COVID in the past.  Denies any history of diabetes.  He is having difficulty with daily activities as result of his symptoms.    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   Seizures (HCC)    previously on tegretol , thought to be secondary to Ultram on one occurance   Seizures (HCC)    last 2-08  from medication    Patient Active Problem List   Diagnosis Date Noted   HNP (herniated nucleus pulposus), lumbar 07/13/2014   Allergic rhinitis 07/19/2011   Knee pain, right 04/15/2011   Hypertension 04/14/2011   Chronic pain syndrome 01/13/2011   CANNABIS ABUSE 05/29/2008   TOBACCO ABUSE 01/18/2008   UNSPECIFIED EPISODIC MOOD DISORDER 01/09/2008   SEIZURE DISORDER 04/21/2007   BACK PAIN, LUMBAR, WITH RADICULOPATHY 03/22/2007    Past Surgical History:  Procedure  Laterality Date   APPENDECTOMY      as teen   BACK SURGERY  05/15/2003, 07/13/2014   x 2    COLONOSCOPY WITH ESOPHAGOGASTRODUODENOSCOPY (EGD)     Last done in [facility]   pyloric stenosis     2 weeks old       Home Medications    Prior to Admission medications   Medication Sig Start Date End Date Taking? Authorizing Provider  Acetaminophen  (TYLENOL  PO) Take by mouth as needed.   Yes [provider]  acetaminophen  (TYLENOL ) 500 MG tablet Take by mouth.   Yes [provider]  albuterol  (VENTOLIN  HFA) 108 (90 Base) MCG/ACT inhaler Inhale 2 puffs into the lungs every 4 (four) hours as needed for wheezing or shortness of breath. 02/04/21  Yes Rodriguez-Southworth, Sylvia, PA-C  albuterol  (VENTOLIN  HFA) 108 (90 Base) MCG/ACT inhaler Inhale into the lungs. 06/14/23  Yes [provider]  doxycycline  (VIBRAMYCIN ) 100 MG capsule Take 1 capsule (100 mg total) by mouth 2 (two) times daily. 01/12/24  Yes Shyanne Mcclary K, PA-C  fluticasone  (FLONASE ) 50 MCG/ACT nasal spray Place into the nose. 03/15/18  Yes [provider]  hydrOXYzine  (ATARAX ) 25 MG tablet Take 1 tablet (25 mg total) by mouth every 6 (six) hours as needed for itching. 06/03/23  Yes Mound, Dewey Fordyce, FNP  hydrOXYzine  (ATARAX ) 25 MG  tablet Take by mouth. 06/03/23  Yes [provider]  hydrOXYzine  (VISTARIL ) 25 MG capsule Take by mouth. 09/28/19  Yes [provider]  ibuprofen  (ADVIL ,MOTRIN ) 200 MG tablet Take 200 mg by mouth every 6 (six) hours as needed.   Yes [provider]  methocarbamol  (ROBAXIN ) 500 MG tablet Take 1 tablet (500 mg total) by mouth 2 (two) times daily as needed for muscle spasms. 10/20/23  Yes Mound, Fairy Homer E, FNP  methylPREDNISolone  (MEDROL  DOSEPAK) 4 MG TBPK tablet As directed 01/12/24  Yes Dontrelle Mazon K, PA-C  omeprazole  (PRILOSEC) 40 MG capsule Take 40 mg by mouth daily.   Yes [provider]  triamcinolone cream (KENALOG) 0.5 % Apply topically. 06/01/23 05/31/24  Yes [provider]    Family History Family History  Problem Relation Age of Onset   Healthy Mother    Healthy Father    Diabetes Maternal Grandmother    Heart disease Maternal Grandmother    Colon cancer Neg Hx    Esophageal cancer Neg Hx     Social History Social History   Tobacco Use   Smoking status: Every Day    Current packs/day: 1.00    Average packs/day: 1 pack/day for 19.0 years (19.0 ttl pk-yrs)    Types: Cigarettes   Smokeless tobacco: Never  Vaping Use   Vaping status: Never Used  Substance Use Topics   Alcohol use: No   Drug use: No     Allergies   Bupropion, Ultram [tramadol], Gabapentin, Bupropion hcl, Sulfamethoxazole-trimethoprim, and Tramadol hcl   Review of Systems Review of Systems  Constitutional:  Positive for activity change. Negative for appetite change, fatigue and fever.  HENT:  Positive for congestion, postnasal drip and sinus pressure. Negative for sneezing and sore throat.   Respiratory:  Positive for cough, shortness of breath and wheezing. Negative for chest tightness.   Cardiovascular:  Negative for chest pain.  Gastrointestinal:  Negative for abdominal pain, diarrhea, nausea and vomiting.  Neurological:  Positive for headaches. Negative for dizziness and light-headedness.     Physical Exam Triage Vital Signs ED Triage Vitals  Encounter Vitals Group     BP 01/12/24 0918 (!) 145/85     Systolic BP Percentile --      Diastolic BP Percentile --      Pulse Rate 01/12/24 0918 87     Resp 01/12/24 0918 18     Temp 01/12/24 0918 98.3 F (36.8 C)     Temp Source 01/12/24 0918 Oral     SpO2 01/12/24 0918 96 %     Weight 01/12/24 0920 180 lb (81.6 kg)     Height 01/12/24 0920 5\' 10"  (1.778 m)     Head Circumference --      Peak Flow --      Pain Score 01/12/24 0920 7     Pain Loc --      Pain Education --      Exclude from Growth Chart --    No data found.  Updated Vital Signs BP (!) 145/85 (BP Location: Right Arm)    Pulse 87   Temp 98.3 F (36.8 C) (Oral)   Resp 18   Ht 5\' 10"  (1.778 m)   Wt 180 lb (81.6 kg)   SpO2 96%   BMI 25.83 kg/m   Visual Acuity Right Eye Distance:   Left Eye Distance:   Bilateral Distance:    Right Eye Near:   Left Eye Near:    Bilateral Near:  Physical Exam Vitals reviewed.  Constitutional:      General: He is awake.     Appearance: Normal appearance. He is well-developed. He is not ill-appearing.     Comments: Very pleasant male appears stated age in no acute distress sitting comfortably in exam room  HENT:     Head: Normocephalic and atraumatic.     Right Ear: Tympanic membrane, ear canal and external ear normal. Tympanic membrane is not erythematous or bulging.     Left Ear: Tympanic membrane, ear canal and external ear normal. Tympanic membrane is not erythematous or bulging.     Nose: Nose normal.     Mouth/Throat:     Dentition: Has dentures.     Pharynx: Uvula midline. Postnasal drip present. No oropharyngeal exudate, posterior oropharyngeal erythema or uvula swelling.  Cardiovascular:     Rate and Rhythm: Normal rate and regular rhythm.     Heart sounds: Normal heart sounds, S1 normal and S2 normal. No murmur heard. Pulmonary:     Effort: Pulmonary effort is normal. No accessory muscle usage or respiratory distress.     Breath sounds: No stridor. Examination of the right-lower field reveals decreased breath sounds. Examination of the left-lower field reveals decreased breath sounds. Decreased breath sounds present. No wheezing, rhonchi or rales.  Neurological:     Mental Status: He is alert.  Psychiatric:        Behavior: Behavior is cooperative.      UC Treatments / Results  Labs (all labs ordered are listed, but only abnormal results are displayed) Labs Reviewed - No data to display  EKG   Radiology No results found.  Procedures Procedures (including critical care time)  Medications Ordered in UC Medications  albuterol   (VENTOLIN  HFA) 108 (90 Base) MCG/ACT inhaler 2 puff (2 puffs Inhalation Given 01/12/24 0940)  AeroChamber Plus Flo-Vu Large MISC 1 each (1 each Other Given 01/12/24 0940)    Initial Impression / Assessment and Plan / UC Course  I have reviewed the triage vital signs and the nursing notes.  Pertinent labs & imaging results that were available during my care of the patient were reviewed by me and considered in my medical decision making (see chart for details).     Patient is well-appearing, afebrile, nontoxic, nontachycardic.  Viral testing was deferred as he has been symptomatic for several weeks and this would not change management.  Chest x-ray was obtained that did not show any acute cardiopulmonary disease based on my primary read.  At the time of discharge we were waiting for radiologist over read and we will contact him if this is different and changes our treatment plan.  Will cover for COPD exacerbation given history of recurrent bronchitis and tobacco use.  He was given albuterol  every 4-6 hours as needed.  Will start methylprednisolone  as well as doxycycline .  Discussed that if we need to change or add an antibiotic we will contact him.  He can use over-the-counter medication including Mucinex, Flonase , Tylenol .  He is to follow-up with his primary care next week.  Discussed that if anything worsens and he has increasing cough, shortness of breath, nausea, vomiting, fever, chest pain he needs to go to the emergency room.  Strict return precautions given.  Work excuse note provided.  Final Clinical Impressions(s) / UC Diagnoses   Final diagnoses:  Acute cough  Bronchitis, chronic obstructive w acute bronchitis (HCC)     Discharge Instructions      I did not see anything  on your x-ray but we will contact you if the radiologist sees something and we need to add an additional antibiotic.  Start doxycycline  100 mg twice daily for 10 days.  Stay out of the sun while on this medication.   Start methylprednisolone  as prescribed.  Do not take NSAIDs with this medication including aspirin, ibuprofen /Advil , naproxen/Aleve.  Use albuterol  every 4-6 hours as needed.  Follow-up with your primary care within a week if symptoms have not significantly improved.  If anything worsens and you have shortness of breath, worsening cough, high fever, nausea, vomiting, chest pain you need to go to the emergency room.     ED Prescriptions     Medication Sig Dispense Auth. Provider   doxycycline  (VIBRAMYCIN ) 100 MG capsule Take 1 capsule (100 mg total) by mouth 2 (two) times daily. 20 capsule Tate Jerkins K, PA-C   methylPREDNISolone  (MEDROL  DOSEPAK) 4 MG TBPK tablet As directed 21 tablet Tishia Maestre K, PA-C      PDMP not reviewed this encounter.   Budd Cargo, PA-C 01/12/24 1041

## 2024-01-12 NOTE — Discharge Instructions (Signed)
 I did not see anything on your x-ray but we will contact you if the radiologist sees something and we need to add an additional antibiotic.  Start doxycycline  100 mg twice daily for 10 days.  Stay out of the sun while on this medication.  Start methylprednisolone  as prescribed.  Do not take NSAIDs with this medication including aspirin, ibuprofen /Advil , naproxen/Aleve.  Use albuterol  every 4-6 hours as needed.  Follow-up with your primary care within a week if symptoms have not significantly improved.  If anything worsens and you have shortness of breath, worsening cough, high fever, nausea, vomiting, chest pain you need to go to the emergency room.

## 2024-01-28 ENCOUNTER — Encounter: Payer: Self-pay | Admitting: Internal Medicine

## 2024-01-28 ENCOUNTER — Ambulatory Visit (AMBULATORY_SURGERY_CENTER): Payer: Medicaid Other | Admitting: Internal Medicine

## 2024-01-28 VITALS — BP 117/66 | HR 69 | Temp 97.9°F | Resp 10 | Ht 70.0 in | Wt 180.0 lb

## 2024-01-28 DIAGNOSIS — K219 Gastro-esophageal reflux disease without esophagitis: Secondary | ICD-10-CM

## 2024-01-28 DIAGNOSIS — K449 Diaphragmatic hernia without obstruction or gangrene: Secondary | ICD-10-CM | POA: Diagnosis not present

## 2024-01-28 DIAGNOSIS — K6389 Other specified diseases of intestine: Secondary | ICD-10-CM

## 2024-01-28 DIAGNOSIS — K648 Other hemorrhoids: Secondary | ICD-10-CM

## 2024-01-28 DIAGNOSIS — K222 Esophageal obstruction: Secondary | ICD-10-CM

## 2024-01-28 DIAGNOSIS — Z1211 Encounter for screening for malignant neoplasm of colon: Secondary | ICD-10-CM

## 2024-01-28 DIAGNOSIS — K3189 Other diseases of stomach and duodenum: Secondary | ICD-10-CM

## 2024-01-28 MED ORDER — OMEPRAZOLE 40 MG PO CPDR: 40.0000 mg | DELAYED_RELEASE_CAPSULE | Freq: Two times a day (BID) | ORAL | 0 refills | Status: AC

## 2024-01-28 MED ORDER — SODIUM CHLORIDE 0.9 % IV SOLN: 500.0000 mL | Freq: Once | INTRAVENOUS | Status: DC

## 2024-01-28 NOTE — Patient Instructions (Addendum)
Resume previous diet Continue present medications Await pathology results  Handouts/information given for Hiatal hernia, omeprazole, GERD and hemorrhoids  YOU HAD AN ENDOSCOPIC PROCEDURE TODAY AT THE [facility]:   Refer to the procedure report that was given to you for any specific questions about what was found during the examination.  If the procedure report does not answer your questions, please call your gastroenterologist to clarify.  If you requested that your care partner not be given the details of your procedure findings, then the procedure report has been included in a sealed envelope for you to review at your convenience later.  YOU SHOULD EXPECT: Some feelings of bloating in the abdomen. Passage of more gas than usual.  Walking can help get rid of the air that was put into your GI tract during the procedure and reduce the bloating. If you had a lower endoscopy (such as a colonoscopy or flexible sigmoidoscopy) you may notice spotting of blood in your stool or on the toilet paper. If you underwent a bowel prep for your procedure, you may not have a normal bowel movement for a few days.  Please Note:  You might notice some irritation and congestion in your nose or some drainage.  This is from the oxygen used during your procedure.  There is no need for concern and it should clear up in a day or so.  SYMPTOMS TO REPORT IMMEDIATELY:  Following lower endoscopy (colonoscopy):  Excessive amounts of blood in the stool  Significant tenderness or worsening of abdominal pains  Swelling of the abdomen that is new, acute  Fever of 100F or higher Following upper endoscopy (EGD)  Vomiting of blood or coffee ground material  New chest pain or pain under the shoulder blades  Painful or persistently difficult swallowing  New shortness of breath  Black, tarry-looking stools  For urgent or emergent issues, a gastroenterologist can be reached at any hour by calling [phone]. Do  not use MyChart messaging for urgent concerns.   DIET:  We do recommend a small meal at first, but then you may proceed to your regular diet.  Drink plenty of fluids but you should avoid alcoholic beverages for 24 hours.  ACTIVITY:  You should plan to take it easy for the rest of today and you should NOT DRIVE or use heavy machinery until tomorrow (because of the sedation medicines used during the test).    FOLLOW UP: Our staff will call the number listed on your records the next business day following your procedure.  We will call around 7:15- 8:00 am to check on you and address any questions or concerns that you may have regarding the information given to you following your procedure. If we do not reach you, we will leave a message.     If any biopsies were taken you will be contacted by phone or by letter within the next 1-3 weeks.  Please call us at [phone] if you have not heard about the biopsies in 3 weeks.   SIGNATURES/CONFIDENTIALITY: You and/or your care partner have signed paperwork which will be entered into your electronic medical record.  These signatures attest to the fact that that the information above on your After Visit Summary has been reviewed and is understood.  Full responsibility of the confidentiality of this discharge information lies with you and/or your care-partner.

## 2024-01-28 NOTE — Progress Notes (Signed)
GASTROENTEROLOGY PROCEDURE H&P NOTE   Primary Care Physician: Macy Mis, MD    Reason for Procedure:   N&V, GERD, colon cancer screening  Plan:    EGD/colonoscopy  Patient is appropriate for endoscopic procedure(s) in the ambulatory ([facility]) setting.  The nature of the procedure, as well as the risks, benefits, and alternatives were carefully and thoroughly reviewed with the patient. Ample time for discussion and questions allowed. The patient understood, was satisfied, and agreed to proceed.     HPI: Brent Reed is a 48 y.o. male who presents for EGD/colonoscopy for evaluation of N&V, GERD, and colon cancer screening.  Patient was most recently seen in the Gastroenterology Clinic on 10/08/23.  No interval change in medical history since that appointment. Please refer to that note for full details regarding GI history and clinical presentation.   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 Authorizing Provider  Acetaminophen (TYLENOL PO) Take by mouth as needed.    [provider]  acetaminophen (TYLENOL) 500 MG tablet Take by mouth.    [provider]  albuterol (VENTOLIN HFA) 108 (90 Base) MCG/ACT inhaler Inhale 2 puffs into the lungs every 4 (four) hours as needed for  wheezing or shortness of breath. 02/04/21   Rodriguez-Southworth, Nettie Elm, PA-C  albuterol (VENTOLIN HFA) 108 (90 Base) MCG/ACT inhaler Inhale into the lungs. 06/14/23   [provider]  doxycycline (VIBRAMYCIN) 100 MG capsule Take 1 capsule (100 mg total) by mouth 2 (two) times daily. 01/12/24   Raspet, Noberto Retort, PA-C  fluticasone (FLONASE) 50 MCG/ACT nasal spray Place into the nose. 03/15/18   [provider]  hydrOXYzine (ATARAX) 25 MG tablet Take 1 tablet (25 mg total) by mouth every 6 (six) hours as needed for itching. 06/03/23   Gustavus Bryant, FNP  hydrOXYzine (ATARAX) 25 MG tablet Take by mouth. 06/03/23   [provider]  hydrOXYzine (VISTARIL) 25 MG capsule Take by mouth. 09/28/19   [provider]  ibuprofen (ADVIL,MOTRIN) 200 MG tablet Take 200 mg by mouth every 6 (six) hours as needed.    [provider]  methocarbamol (ROBAXIN) 500 MG tablet Take 1 tablet (500 mg total) by mouth 2 (two) times daily as needed for muscle spasms. 10/20/23   Gustavus Bryant, FNP  methylPREDNISolone (MEDROL DOSEPAK) 4 MG TBPK tablet As directed 01/12/24   Raspet, Erin K, PA-C  omeprazole (PRILOSEC) 40 MG capsule Take 40 mg by mouth daily.    [provider]  triamcinolone cream (KENALOG) 0.5 % Apply topically. 06/01/23 05/31/24  [provider]    Current Outpatient Medications  Medication Sig Dispense Refill   Acetaminophen (TYLENOL PO) Take by mouth as needed.     acetaminophen (TYLENOL) 500 MG tablet Take by mouth.     albuterol (VENTOLIN HFA) 108 (90 Base) MCG/ACT inhaler Inhale 2 puffs into the lungs every 4 (four) hours as needed for wheezing or shortness of breath. 20 each 0   albuterol (VENTOLIN HFA) 108 (90 Base) MCG/ACT inhaler Inhale into the lungs.     doxycycline (VIBRAMYCIN) 100 MG capsule Take 1 capsule (100 mg total) by mouth 2 (two) times daily. 20 capsule 0   fluticasone (FLONASE) 50 MCG/ACT nasal spray Place into the nose.     hydrOXYzine  (ATARAX) 25 MG tablet Take 1 tablet (25 mg total) by mouth every 6 (six) hours as needed for itching. 12 tablet 0   hydrOXYzine (ATARAX) 25 MG tablet Take by mouth.     hydrOXYzine (VISTARIL) 25 MG capsule Take by mouth.     ibuprofen (ADVIL,MOTRIN) 200 MG tablet Take 200 mg by mouth every 6 (six) hours as needed.     methocarbamol (ROBAXIN) 500 MG tablet Take 1 tablet (500 mg total) by mouth 2 (two) times daily as needed for muscle spasms. 20 tablet 0   methylPREDNISolone (MEDROL DOSEPAK) 4 MG TBPK tablet As directed 21 tablet 0   omeprazole (PRILOSEC) 40 MG capsule Take 40 mg by mouth daily.     triamcinolone cream (KENALOG) 0.5 % Apply topically.     Current Facility-Administered Medications  Medication Dose Route Frequency Provider Last Rate Last Admin   0.9 %  sodium chloride infusion  500 mL Intravenous Once Imogene Burn, MD        Allergies as of 01/28/2024 - Review Complete 01/28/2024  Allergen Reaction Noted   Bupropion Other (See Comments) 09/13/2013   Ultram [tramadol] Other (See Comments) 07/13/2014   Dicyclomine Rash 06/14/2023   Gabapentin Other (See Comments) 10/12/2016   Sulfamethoxazole-trimethoprim Rash 05/22/2009    Family History  Problem Relation Age of Onset   Healthy Mother    Healthy Father    Diabetes Maternal Grandmother    Heart disease Maternal Grandmother    Colon cancer Neg Hx    Esophageal cancer Neg Hx     Social History   Socioeconomic History   Marital status: Divorced    Spouse name: Not on file   Number of children: 3   Years of education: 10   Highest education level: Not on file  Occupational History    Comment: [employer]  Tobacco Use   Smoking status: Every Day    Current packs/day: 1.00    Average packs/day: 1 pack/day for 19.0 years (19.0 ttl pk-yrs)    Types: Cigarettes   Smokeless tobacco: Never  Vaping Use   Vaping status: Never Used  Substance and Sexual Activity   Alcohol use: No   Drug use: No   Sexual activity:  Yes  Other Topics Concern   Not on file  Social History Narrative   ** Merged History Encounter **   Lives with 3 children   Caffeine - Mtn Dew all day       Social Drivers of Health   Financial Resource Strain: Medium Risk (05/20/2023)   Received from [facility], [facility]   Overall Financial Resource Strain (CARDIA)    Difficulty of Paying Living Expenses: Somewhat hard  Food Insecurity: No Food Insecurity (05/20/2023)   Received from [facility], [facility]  Hunger Vital Sign    Worried About Running Out of Food in the Last Year: Never true    Ran Out of Food in the Last Year: Never true  Transportation Needs: No Transportation Needs (05/20/2023)   Received from [facility], [facility]   PRAPARE - Transportation    Lack of Transportation (Medical): No    Lack of Transportation (Non-Medical): No  Physical Activity: Sufficiently Active (05/20/2023)   Received from [facility], [facility]   Exercise Vital Sign    Days of Exercise per Week: 7 days    Minutes of Exercise per Session: 110 min  Stress: Stress Concern Present (05/20/2023)   Received from [facility], [facility] - Occupational Stress Questionnaire    Feeling of Stress : To some extent  Social Connections: Somewhat Isolated (05/20/2023)   Received from [facility], [facility]   Social Network    How would you rate your social network (family, work, friends)?: Restricted participation with some degree of social isolation  Intimate Partner Violence: Not At Risk (05/20/2023)   Received from [facility], [facility]   HITS    Over the last 12 months how often did your partner physically hurt you?: Never    Over the last 12 months how often did your partner insult you or talk down to you?: Never    Over the last 12 months how often did your partner threaten you with physical harm?: Never    Over the last 12 months how often did your partner  scream or curse at you?: Never    Physical Exam: Vital signs in last 24 hours: BP (!) 141/84   Pulse 60   Temp 97.9 F (36.6 C) (Temporal)   Ht 5\' 10"  (1.778 m)   Wt 180 lb (81.6 kg)   SpO2 99%   BMI 25.83 kg/m  GEN: NAD EYE: Sclerae anicteric ENT: MMM CV: Non-tachycardic Pulm: No increased WOB GI: Soft NEURO:  Alert & Oriented   Eulah Pont, MD [facility]   01/28/2024 2:25 PM

## 2024-01-28 NOTE — Op Note (Signed)
[facility] Patient Name: Kier Smead Procedure Date: 01/28/2024 3:09 PM MRN: [id number] Endoscopist: Madelyn Brunner [place] , , [id number] Age: 48 Referring MD:  Date of Birth: [birth year] Gender: Male Account #: [id number] Procedure:                Upper GI endoscopy Indications:              Heartburn, Nausea with vomiting Medicines:                Monitored Anesthesia Care Procedure:                Pre-Anesthesia Assessment:                           - Prior to the procedure, a History and Physical                            was performed, and patient medications and                            allergies were reviewed. The patient's tolerance of                            previous anesthesia was also reviewed. The risks                            and benefits of the procedure and the sedation                            options and risks were discussed with the patient.                            All questions were answered, and informed consent                            was obtained. Prior Anticoagulants: The patient has                            taken no anticoagulant or antiplatelet agents. ASA                            Grade Assessment: II - A patient with mild systemic                            disease. After reviewing the risks and benefits,                            the patient was deemed in satisfactory condition to                            undergo the procedure.                           After obtaining informed consent, the endoscope was  passed under direct vision. Throughout the                            procedure, the patient's blood pressure, pulse, and                            oxygen saturations were monitored continuously. The                            Olympus Scope SN [id number] was introduced through the                            mouth, and advanced to the second part of duodenum.                            The upper GI  endoscopy was accomplished without                            difficulty. The patient tolerated the procedure                            well. Scope In: Scope Out: Findings:                 A non-obstructing Schatzki ring was found at the                            gastroesophageal junction.                           A 2 cm hiatal hernia was present.                           Localized mildly erythematous mucosa without                            bleeding was found in the gastric antrum. Biopsies                            were taken with a cold forceps for histology.                           Lymphangiectasia was present in the second portion                            of the duodenum. Biopsies were taken with a cold                            forceps for histology. Complications:            No immediate complications. Estimated Blood Loss:     Estimated blood loss was minimal. Impression:               - Non-obstructing Schatzki ring.                           -  2 cm hiatal hernia.                           - Erythematous mucosa in the antrum. Biopsied.                           - Duodenal mucosal lymphangiectasia. Recommendation:           - Await pathology results.                           - Trial of omeprazole 40 mg BID for 8 weeks, then                            decrease to QD                           - Return to GI clinic in 2-3 months.                           - Perform a colonoscopy today. Dr Particia Lather "Alan Ripper" Leonides Schanz,  01/28/2024 3:38:44 PM

## 2024-01-28 NOTE — Progress Notes (Signed)
 Called to room to assist during endoscopic procedure.  Patient ID and intended procedure confirmed with present staff. Received instructions for my participation in the procedure from the performing physician.

## 2024-01-28 NOTE — Op Note (Signed)
[facility] Patient Name: Brent Reed Procedure Date: 01/28/2024 3:06 PM MRN: [id number] Endoscopist: Madelyn Brunner [place] , , [id number] Age: 48 Referring MD:  Date of Birth: 1976/02/02 Gender: Male Account #: [id number] Procedure:                Colonoscopy Indications:              Screening for colorectal malignant neoplasm Medicines:                Monitored Anesthesia Care Procedure:                Pre-Anesthesia Assessment:                           - Prior to the procedure, a History and Physical                            was performed, and patient medications and                            allergies were reviewed. The patient's tolerance of                            previous anesthesia was also reviewed. The risks                            and benefits of the procedure and the sedation                            options and risks were discussed with the patient.                            All questions were answered, and informed consent                            was obtained. Prior Anticoagulants: The patient has                            taken no anticoagulant or antiplatelet agents. ASA                            Grade Assessment: II - A patient with mild systemic                            disease. After reviewing the risks and benefits,                            the patient was deemed in satisfactory condition to                            undergo the procedure.                           After obtaining informed consent, the colonoscope  was passed under direct vision. Throughout the                            procedure, the patient's blood pressure, pulse, and                            oxygen saturations were monitored continuously. The                            Olympus [id number] ([id number]) Colonoscope was                            introduced through the anus and advanced to the the                            terminal  ileum. The colonoscopy was performed                            without difficulty. The patient tolerated the                            procedure well. The quality of the bowel                            preparation was good. The terminal ileum, ileocecal                            valve, appendiceal orifice, and rectum were                            photographed. Scope In: 3:19:19 PM Scope Out: 3:33:43 PM Scope Withdrawal Time: 0 hours 10 minutes 37 seconds  Total Procedure Duration: 0 hours 14 minutes 24 seconds  Findings:                 A localized area of mucosa in the terminal ileum                            was mildly erythematous. This was biopsied with a                            cold forceps for histology.                           Non-bleeding internal hemorrhoids were found during                            retroflexion. Complications:            No immediate complications. Estimated Blood Loss:     Estimated blood loss was minimal. Impression:               - Erythematous mucosa in the terminal ileum.                            Biopsied.                           -  Non-bleeding internal hemorrhoids. Recommendation:           - Discharge patient to home (with escort).                           - Await pathology results.                           - The findings and recommendations were discussed                            with the patient. Dr Particia Lather "Alan Ripper" Leonides Schanz,  01/28/2024 3:40:14 PM

## 2024-01-28 NOTE — Progress Notes (Signed)
 Report to PACU, RN, vss, BBS= Clear.

## 2024-01-31 ENCOUNTER — Telehealth: Payer: Self-pay

## 2024-01-31 NOTE — Telephone Encounter (Signed)
 Left message on follow up call.

## 2024-02-02 LAB — SURGICAL PATHOLOGY

## 2024-02-03 ENCOUNTER — Encounter: Payer: Self-pay | Admitting: Internal Medicine

## 2024-08-02 ENCOUNTER — Other Ambulatory Visit: Payer: Self-pay | Admitting: Neurosurgery

## 2024-08-02 ENCOUNTER — Encounter: Payer: Self-pay | Admitting: Neurosurgery

## 2024-08-02 DIAGNOSIS — M5430 Sciatica, unspecified side: Secondary | ICD-10-CM

## 2024-08-10 ENCOUNTER — Ambulatory Visit
Admission: RE | Admit: 2024-08-10 | Discharge: 2024-08-10 | Disposition: A | Discharge status: Home / Self Care | Source: Ambulatory Visit | Attending: Neurosurgery | Admitting: Neurosurgery

## 2024-08-10 DIAGNOSIS — M5430 Sciatica, unspecified side: Secondary | ICD-10-CM

## 2024-08-17 ENCOUNTER — Other Ambulatory Visit: Payer: Self-pay | Admitting: Neurosurgery

## 2024-09-06 NOTE — Progress Notes (Signed)
 Surgical Instructions   Your procedure is scheduled on Monday, September 15th. Report to [facility] Main Entrance A at 5:30 A.M., then check in with the Admitting office. Any questions or running late day of surgery: call [phone]  Questions prior to your surgery date: call [phone], Monday-Friday, 8am-4pm. If you experience any cold or flu symptoms such as cough, fever, chills, shortness of breath, etc. between now and your scheduled surgery, please notify us  at the above number.     Remember:  Do not eat after midnight the night before your surgery  You may drink clear liquids until 4:30 the morning of your surgery.   Clear liquids allowed are: Water, Non-Citrus Juices (without pulp), Carbonated Beverages, Clear Tea (no milk, honey, etc.), Black Coffee Only (NO MILK, CREAM OR POWDERED CREAMER of any kind), and Gatorade.    Take these medicines the morning of surgery with A SIP OF WATER  omeprazole  (PRILOSEC)    May take these medicines IF NEEDED: Acetaminophen  (TYLENOL )  albuterol  (VENTOLIN  HFA) inhaler - bring with you on day of surgery  fluticasone  (FLONASE )  hydrOXYzine  (ATARAX )   One week prior to surgery, STOP taking any Aspirin (unless otherwise instructed by your surgeon) Aleve, Naproxen, Ibuprofen , Motrin , Advil , Goody's, BC's, all herbal medications, fish oil, and non-prescription vitamins.                     Do NOT Smoke (Tobacco/Vaping) for 24 hours prior to your procedure.  If you use a CPAP at night, you may bring your mask/headgear for your overnight stay.   You will be asked to remove any contacts, glasses, piercing's, hearing aid's, dentures/partials prior to surgery. Please bring cases for these items if needed.    Patients discharged the day of surgery will not be allowed to drive home, and someone needs to stay with them for 24 hours.  SURGICAL WAITING ROOM VISITATION Patients may have no more than 2 support people in the waiting area - these  visitors may rotate.   Pre-op nurse will coordinate an appropriate time for 1 ADULT support person, who may not rotate, to accompany patient in pre-op.  Children under the age of 37 must have an adult with them who is not the patient and must remain in the main waiting area with an adult.  If the patient needs to stay at the hospital during part of their recovery, the visitor guidelines for inpatient rooms apply.  Please refer to the [facility] website for the visitor guidelines for any additional information.   If you received a COVID test during your pre-op visit  it is requested that you wear a mask when out in public, stay away from anyone that may not be feeling well and notify your surgeon if you develop symptoms. If you have been in contact with anyone that has tested positive in the last 10 days please notify you surgeon.      Pre-operative 5 CHG Bathing Instructions   You can play a key role in reducing the risk of infection after surgery. Your skin needs to be as free of germs as possible. You can reduce the number of germs on your skin by washing with CHG (chlorhexidine gluconate) soap before surgery. CHG is an antiseptic soap that kills germs and continues to kill germs even after washing.   DO NOT use if you have an allergy to chlorhexidine/CHG or antibacterial soaps. If your skin becomes reddened or irritated, stop using the CHG and notify  one of our RNs at [phone].   Please shower with the CHG soap starting 4 days before surgery using the following schedule:     Please keep in mind the following:  DO NOT shave, including legs and underarms, starting the day of your first shower.   You may shave your face at any point before/day of surgery.  Place clean sheets on your bed the day you start using CHG soap. Use a clean washcloth (not used since being washed) for each shower. DO NOT sleep with pets once you start using the CHG.   CHG Shower Instructions:  Wash your  face and private area with normal soap. If you choose to wash your hair, wash first with your normal shampoo.  After you use shampoo/soap, rinse your hair and body thoroughly to remove shampoo/soap residue.  Turn the water OFF and apply about 3 tablespoons (45 ml) of CHG soap to a CLEAN washcloth.  Apply CHG soap ONLY FROM YOUR NECK DOWN TO YOUR TOES (washing for 3-5 minutes)  DO NOT use CHG soap on face, private areas, open wounds, or sores.  Pay special attention to the area where your surgery is being performed.  If you are having back surgery, having someone wash your back for you may be helpful. Wait 2 minutes after CHG soap is applied, then you may rinse off the CHG soap.  Pat dry with a clean towel  Put on clean clothes/pajamas   If you choose to wear lotion, please use ONLY the CHG-compatible lotions that are listed below.  Additional instructions for the day of surgery: DO NOT APPLY any lotions, deodorants, cologne, or perfumes.   Do not bring valuables to the hospital. [facility] is not responsible for any belongings/valuables. Do not wear nail polish, gel polish, artificial nails, or any other type of covering on natural nails (fingers and toes) Do not wear jewelry or makeup Put on clean/comfortable clothes.  Please brush your teeth.  Ask your nurse before applying any prescription medications to the skin.     CHG Compatible Lotions   Aveeno Moisturizing lotion  Cetaphil Moisturizing Cream  Cetaphil Moisturizing Lotion  Clairol Herbal Essence Moisturizing Lotion, Dry Skin  Clairol Herbal Essence Moisturizing Lotion, Extra Dry Skin  Clairol Herbal Essence Moisturizing Lotion, Normal Skin  Curel Age Defying Therapeutic Moisturizing Lotion with Alpha Hydroxy  Curel Extreme Care Body Lotion  Curel Soothing Hands Moisturizing Hand Lotion  Curel Therapeutic Moisturizing Cream, Fragrance-Free  Curel Therapeutic Moisturizing Lotion, Fragrance-Free  Curel Therapeutic  Moisturizing Lotion, Original Formula  Eucerin Daily Replenishing Lotion  Eucerin Dry Skin Therapy Plus Alpha Hydroxy Crme  Eucerin Dry Skin Therapy Plus Alpha Hydroxy Lotion  Eucerin Original Crme  Eucerin Original Lotion  Eucerin Plus Crme Eucerin Plus Lotion  Eucerin TriLipid Replenishing Lotion  Keri Anti-Bacterial Hand Lotion  Keri Deep Conditioning Original Lotion Dry Skin Formula Softly Scented  Keri Deep Conditioning Original Lotion, Fragrance Free Sensitive Skin Formula  Keri Lotion Fast Absorbing Fragrance Free Sensitive Skin Formula  Keri Lotion Fast Absorbing Softly Scented Dry Skin Formula  Keri Original Lotion  Keri Skin Renewal Lotion Keri Silky Smooth Lotion  Keri Silky Smooth Sensitive Skin Lotion  Nivea Body Creamy Conditioning Oil  Nivea Body Extra Enriched Teacher, adult education Moisturizing Lotion Nivea Crme  Nivea Skin Firming Lotion  NutraDerm 30 Skin Lotion  NutraDerm Skin Lotion  NutraDerm Therapeutic Skin Cream  NutraDerm Therapeutic Skin Lotion  ProShield Protective Hand  Cream  Provon moisturizing lotion  Please read over the following fact sheets that you were given.

## 2024-09-07 ENCOUNTER — Encounter (HOSPITAL_COMMUNITY)
Admission: RE | Admit: 2024-09-07 | Discharge: 2024-09-07 | Disposition: A | Discharge status: Home / Self Care | Source: Ambulatory Visit | Attending: Neurosurgery | Admitting: Neurosurgery

## 2024-09-07 ENCOUNTER — Other Ambulatory Visit: Payer: Self-pay

## 2024-09-07 ENCOUNTER — Encounter (HOSPITAL_COMMUNITY): Payer: Self-pay

## 2024-09-07 VITALS — BP 163/96 | HR 92 | Temp 99.1°F | Resp 17 | Ht 70.0 in | Wt 197.3 lb

## 2024-09-07 DIAGNOSIS — M5440 Lumbago with sciatica, unspecified side: Secondary | ICD-10-CM | POA: Diagnosis not present

## 2024-09-07 DIAGNOSIS — K449 Diaphragmatic hernia without obstruction or gangrene: Secondary | ICD-10-CM | POA: Insufficient documentation

## 2024-09-07 DIAGNOSIS — K219 Gastro-esophageal reflux disease without esophagitis: Secondary | ICD-10-CM | POA: Diagnosis not present

## 2024-09-07 DIAGNOSIS — I10 Essential (primary) hypertension: Secondary | ICD-10-CM | POA: Diagnosis not present

## 2024-09-07 DIAGNOSIS — Z87891 Personal history of nicotine dependence: Secondary | ICD-10-CM | POA: Insufficient documentation

## 2024-09-07 DIAGNOSIS — Z01818 Encounter for other preprocedural examination: Secondary | ICD-10-CM | POA: Insufficient documentation

## 2024-09-07 LAB — TYPE AND SCREEN
ABO/RH(D): O POS
Antibody Screen: NEGATIVE

## 2024-09-07 LAB — BASIC METABOLIC PANEL WITH GFR
Anion gap: 10 (ref 5–15)
BUN: 10 mg/dL (ref 6–20)
CO2: 24 mmol/L (ref 22–32)
Calcium: 9.1 mg/dL (ref 8.9–10.3)
Chloride: 101 mmol/L (ref 98–111)
Creatinine, Ser: 1.08 mg/dL (ref 0.61–1.24)
GFR, Estimated: >60 mL/min (ref >60)
Glucose, Bld: 115 mg/dL — ABNORMAL HIGH (ref 70–99)
Potassium: 3.7 mmol/L (ref 3.5–5.1)
Sodium: 135 mmol/L (ref 135–145)

## 2024-09-07 LAB — CBC
HCT: 45 % (ref 39.0–52.0)
Hemoglobin: 15.9 g/dL (ref 13.0–17.0)
MCH: 31.1 pg (ref 26.0–34.0)
MCHC: 35.3 g/dL (ref 30.0–36.0)
MCV: 88.1 fL (ref 80.0–100.0)
Platelets: 321 K/uL (ref 150–400)
RBC: 5.11 MIL/uL (ref 4.22–5.81)
RDW: 13.1 % (ref 11.5–15.5)
WBC: 12.9 K/uL — ABNORMAL HIGH (ref 4.0–10.5)
nRBC: 0 % (ref 0.0–0.2)

## 2024-09-07 LAB — SURGICAL PCR SCREEN
MRSA, PCR: NEGATIVE
Staphylococcus aureus: NEGATIVE

## 2024-09-07 NOTE — Progress Notes (Signed)
 PCP - Dr. Luke Manns Cardiologist - Denies  PPM/ICD - Denies Device Orders - N/A Rep Notified - N/A  Chest x-ray - N/A EKG - 09-07-24 Stress Test - Denies ECHO - Denies Cardiac Cath - Denies  Sleep Study - Denies CPAP - N/A  NON-diabetic  Last dose of GLP1 agonist-  Denies GLP1 instructions: N/A  Blood Thinner Instructions: Denies Aspirin Instructions: Denies  ERAS Protcol - Clears until 0430 PRE-SURGERY Ensure or G2- none  COVID TEST- N/A   Anesthesia review: Yes, HTN,  hx of seizures  Patient denies shortness of breath, fever, cough and chest pain at PAT appointment. Patient denies any respiratory issues at this time.    All instructions explained to the patient, with a verbal understanding of the material. Patient agrees to go over the instructions while at home for a better understanding. Patient also instructed to self quarantine after being tested for COVID-19. The opportunity to ask questions was provided.

## 2024-09-08 NOTE — Progress Notes (Signed)
 Anesthesia Chart Review:  Case: [id number] Date/Time: 09/11/24 0715   Procedure: POSTERIOR LUMBAR FUSION 1 WITH HARDWARE REMOVAL (Back) - PLIF - L3-L4 extension with removal of hardware - Interbody Fusion   Anesthesia type: General   Diagnosis: Low back pain with sciatica, sciatica laterality unspecified, unspecified back pain laterality, unspecified chronicity [M54.40]   Pre-op diagnosis: Lumbago with sciatica unspecified side   Location: [facility] OR ROOM 18 / [facility] OR   Surgeons: Onetha Kuba, MD       DISCUSSION: Patient is a 48 year old male scheduled for the above procedure.  History includes smoking, HTN, seizure (2008, in setting of tramadol), pyloric stenosis (s/p repair), GERD, hiatal hernia, spinal surgery (right L4-5 laminectomy/diskectomy 05/25/2003; L4-S1 PLIF 07/13/2024).  Anesthesia team to evaluate on the day of surgery.    VS: BP (!) 163/96   Pulse 92   Temp 37.3 C   Resp 17   Ht 5' 10 (1.778 m)   Wt 89.5 kg   SpO2 98%   BMI 28.31 kg/m   PROVIDERS: Rena Luke POUR, MD is PCP    LABS: Labs reviewed: Acceptable for surgery. (all labs ordered are listed, but only abnormal results are displayed)  Labs Reviewed  BASIC METABOLIC PANEL WITH GFR - Abnormal; Notable for the following components:      Result Value   Glucose, Bld 115 (*)    All other components within normal limits  CBC - Abnormal; Notable for the following components:   WBC 12.9 (*)    All other components within normal limits  SURGICAL PCR SCREEN  TYPE AND SCREEN    IMAGES: CT L-spine 08/10/2024: IMPRESSION: 1. Status post L4-S1 discectomy and fusion. No stenosis or evidence of complication. 2. Spondylosis at L3-4 where there is moderate spinal stenosis and mild to moderate bilateral foraminal narrowing, worse on the right. 3. Fatty infiltration of the liver. - Aortic Atherosclerosis (ICD10-I70.0).  CXR 01/12/2024: FINDINGS: The heart size and mediastinal contours are within normal limits.  No consolidation, pneumothorax or effusion. No edema. The visualized skeletal structures are unremarkable. IMPRESSION: No acute cardiopulmonary disease.   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   Sciatica    Seizures (HCC)    previously on tegretol , thought to be secondary to Ultram on one occurance   Seizures (HCC)    last 2-08  from medication    Past Surgical History:  Procedure Laterality Date   APPENDECTOMY      as teen   BACK SURGERY  05/15/2003, 07/13/2014   x 2    COLONOSCOPY     COLONOSCOPY WITH ESOPHAGOGASTRODUODENOSCOPY (EGD)     Last done in [facility]   pyloric stenosis     97 weeks old   UPPER GASTROINTESTINAL ENDOSCOPY      MEDICATIONS:  acetaminophen  (TYLENOL ) 500 MG tablet   albuterol  (VENTOLIN  HFA) 108 (90 Base) MCG/ACT inhaler   fluticasone  (FLONASE ) 50 MCG/ACT nasal  spray   hydrOXYzine  (ATARAX ) 25 MG tablet   omeprazole  (PRILOSEC) 40 MG capsule   No current facility-administered medications for this encounter.    Isaiah Ruder, PA-C Surgical Short Stay/Anesthesiology [facility] Phone [phone] [facility] Phone [phone] 09/08/2024 10:39 AM

## 2024-09-08 NOTE — Anesthesia Preprocedure Evaluation (Addendum)
 Anesthesia Evaluation  Patient identified by MRN, date of birth, ID band Patient awake    Reviewed: Allergy & Precautions, H&P , NPO status , Patient's Chart, lab work & pertinent test results  History of Anesthesia Complications Negative for: history of anesthetic complications  Airway Mallampati: II  TM Distance: >3 FB Neck ROM: Full    Dental  (+) Edentulous Upper, Edentulous Lower   Pulmonary Current Smoker and Patient abstained from smoking.   Pulmonary exam normal breath sounds clear to auscultation       Cardiovascular Exercise Tolerance: Good hypertension, Normal cardiovascular exam Rhythm:Regular Rate:Normal     Neuro/Psych Seizures -, Well Controlled,   Anxiety Depression    negative neurological ROS  negative psych ROS   GI/Hepatic negative GI ROS, Neg liver ROS, hiatal hernia,GERD  Medicated,,  Endo/Other  negative endocrine ROS    Renal/GU negative Renal ROS  negative genitourinary   Musculoskeletal  (+) Arthritis , Osteoarthritis,    Abdominal   Peds  Hematology negative hematology ROS (+)   Anesthesia Other Findings   Reproductive/Obstetrics negative OB ROS                              Anesthesia Physical Anesthesia Plan  ASA: 2  Anesthesia Plan: General   Post-op Pain Management: Tylenol  PO (pre-op)*   Induction: Intravenous  PONV Risk Score and Plan: 2 and Ondansetron , Dexamethasone  and Midazolam   Airway Management Planned: Oral ETT  Additional Equipment:   Intra-op Plan:   Post-operative Plan: Extubation in OR  Informed Consent: I have reviewed the patients History and Physical, chart, labs and discussed the procedure including the risks, benefits and alternatives for the proposed anesthesia with the patient or authorized representative who has indicated his/her understanding and acceptance.       Plan Discussed with:   Anesthesia Plan Comments:  (PAT note written 09/08/2024 by Allison Zelenak, PA-C.  )         Anesthesia Quick Evaluation

## 2024-09-11 ENCOUNTER — Other Ambulatory Visit: Payer: Self-pay

## 2024-09-11 ENCOUNTER — Encounter (HOSPITAL_COMMUNITY)
Admission: AD | Disposition: A | Discharge status: Home / Self Care | Payer: Self-pay | Source: Home / Self Care | Attending: Neurosurgery

## 2024-09-11 ENCOUNTER — Inpatient Hospital Stay (HOSPITAL_COMMUNITY)
Admission: AD | Admit: 2024-09-11 | Discharge: 2024-09-14 | DRG: 402 | Disposition: A | Discharge status: Home / Self Care | Attending: Neurosurgery | Admitting: Neurosurgery

## 2024-09-11 ENCOUNTER — Ambulatory Visit (HOSPITAL_COMMUNITY): Payer: Self-pay | Admitting: Vascular Surgery

## 2024-09-11 ENCOUNTER — Ambulatory Visit (HOSPITAL_COMMUNITY)

## 2024-09-11 ENCOUNTER — Ambulatory Visit (HOSPITAL_COMMUNITY): Payer: Self-pay | Admitting: Anesthesiology

## 2024-09-11 ENCOUNTER — Encounter (HOSPITAL_COMMUNITY): Payer: Self-pay | Admitting: Neurosurgery

## 2024-09-11 DIAGNOSIS — I10 Essential (primary) hypertension: Secondary | ICD-10-CM | POA: Diagnosis present

## 2024-09-11 DIAGNOSIS — F419 Anxiety disorder, unspecified: Secondary | ICD-10-CM | POA: Diagnosis present

## 2024-09-11 DIAGNOSIS — Y838 Other surgical procedures as the cause of abnormal reaction of the patient, or of later complication, without mention of misadventure at the time of the procedure: Secondary | ICD-10-CM | POA: Diagnosis not present

## 2024-09-11 DIAGNOSIS — Z882 Allergy status to sulfonamides status: Secondary | ICD-10-CM

## 2024-09-11 DIAGNOSIS — M48061 Spinal stenosis, lumbar region without neurogenic claudication: Secondary | ICD-10-CM | POA: Diagnosis not present

## 2024-09-11 DIAGNOSIS — K219 Gastro-esophageal reflux disease without esophagitis: Secondary | ICD-10-CM | POA: Diagnosis present

## 2024-09-11 DIAGNOSIS — Z79899 Other long term (current) drug therapy: Secondary | ICD-10-CM

## 2024-09-11 DIAGNOSIS — Z8249 Family history of ischemic heart disease and other diseases of the circulatory system: Secondary | ICD-10-CM

## 2024-09-11 DIAGNOSIS — M532X6 Spinal instabilities, lumbar region: Secondary | ICD-10-CM | POA: Diagnosis present

## 2024-09-11 DIAGNOSIS — Z981 Arthrodesis status: Secondary | ICD-10-CM

## 2024-09-11 DIAGNOSIS — Z833 Family history of diabetes mellitus: Secondary | ICD-10-CM

## 2024-09-11 DIAGNOSIS — F32A Depression, unspecified: Secondary | ICD-10-CM | POA: Diagnosis present

## 2024-09-11 DIAGNOSIS — M51362 Other intervertebral disc degeneration, lumbar region with discogenic back pain and lower extremity pain: Principal | ICD-10-CM | POA: Diagnosis present

## 2024-09-11 DIAGNOSIS — M1990 Unspecified osteoarthritis, unspecified site: Secondary | ICD-10-CM | POA: Diagnosis present

## 2024-09-11 DIAGNOSIS — F17210 Nicotine dependence, cigarettes, uncomplicated: Secondary | ICD-10-CM | POA: Diagnosis present

## 2024-09-11 DIAGNOSIS — G9741 Accidental puncture or laceration of dura during a procedure: Secondary | ICD-10-CM | POA: Diagnosis not present

## 2024-09-11 DIAGNOSIS — Z888 Allergy status to other drugs, medicaments and biological substances status: Secondary | ICD-10-CM

## 2024-09-11 LAB — ABO/RH: ABO/RH(D): O POS

## 2024-09-11 SURGERY — POSTERIOR LUMBAR FUSION 1 WITH HARDWARE REMOVAL
Anesthesia: General | Site: Back

## 2024-09-11 MED ORDER — ALBUTEROL SULFATE HFA 108 (90 BASE) MCG/ACT IN AERS
INHALATION_SPRAY | RESPIRATORY_TRACT | Status: DC | PRN
Start: 2024-09-11 — End: 2024-09-11
  Administered 2024-09-11: 4 via RESPIRATORY_TRACT

## 2024-09-11 MED ORDER — THROMBIN 5000 UNITS EX KIT
PACK | CUTANEOUS | Status: AC
  Filled 2024-09-11: qty 1

## 2024-09-11 MED ORDER — SODIUM CHLORIDE 0.9% FLUSH: 3.0000 mL | INTRAVENOUS | Status: DC | PRN

## 2024-09-11 MED ORDER — ROCURONIUM BROMIDE 10 MG/ML (PF) SYRINGE
PREFILLED_SYRINGE | INTRAVENOUS | Status: DC | PRN
  Administered 2024-09-11 (×3): 20 mg via INTRAVENOUS
  Administered 2024-09-11: 60 mg via INTRAVENOUS

## 2024-09-11 MED ORDER — CEFAZOLIN SODIUM-DEXTROSE 2-4 GM/100ML-% IV SOLN
2.0000 g | INTRAVENOUS | Status: AC
  Administered 2024-09-11: 2 g via INTRAVENOUS
  Filled 2024-09-11: qty 100

## 2024-09-11 MED ORDER — ROCURONIUM BROMIDE 10 MG/ML (PF) SYRINGE
PREFILLED_SYRINGE | INTRAVENOUS | Status: AC
  Filled 2024-09-11: qty 10

## 2024-09-11 MED ORDER — ACETAMINOPHEN 650 MG RE SUPP: 650.0000 mg | RECTAL | Status: DC | PRN

## 2024-09-11 MED ORDER — OXYCODONE HCL 5 MG PO TABS: 5.0000 mg | ORAL_TABLET | ORAL | Status: DC | PRN

## 2024-09-11 MED ORDER — CHLORHEXIDINE GLUCONATE CLOTH 2 % EX PADS: 6.0000 | MEDICATED_PAD | Freq: Once | CUTANEOUS | Status: DC

## 2024-09-11 MED ORDER — ACETAMINOPHEN 325 MG PO TABS: 650.0000 mg | ORAL_TABLET | ORAL | Status: DC | PRN

## 2024-09-11 MED ORDER — PROPOFOL 10 MG/ML IV BOLUS
INTRAVENOUS | Status: AC
  Filled 2024-09-11: qty 20

## 2024-09-11 MED ORDER — ONDANSETRON HCL 4 MG PO TABS: 4.0000 mg | ORAL_TABLET | Freq: Four times a day (QID) | ORAL | Status: DC | PRN

## 2024-09-11 MED ORDER — SENNA 8.6 MG PO TABS
1.0000 | ORAL_TABLET | Freq: Two times a day (BID) | ORAL | Status: DC | PRN
  Filled 2024-09-11: qty 1

## 2024-09-11 MED ORDER — ORAL CARE MOUTH RINSE: 15.0000 mL | Freq: Once | OROMUCOSAL | Status: AC

## 2024-09-11 MED ORDER — FENTANYL CITRATE (PF) 250 MCG/5ML IJ SOLN
INTRAMUSCULAR | Status: AC
  Filled 2024-09-11: qty 5

## 2024-09-11 MED ORDER — DOCUSATE SODIUM 100 MG PO CAPS
100.0000 mg | ORAL_CAPSULE | Freq: Two times a day (BID) | ORAL | Status: DC | PRN
  Administered 2024-09-11: 100 mg via ORAL
  Filled 2024-09-11: qty 1

## 2024-09-11 MED ORDER — BUPIVACAINE LIPOSOME 1.3 % IJ SUSP
INTRAMUSCULAR | Status: AC
  Filled 2024-09-11: qty 20

## 2024-09-11 MED ORDER — ALUM & MAG HYDROXIDE-SIMETH 200-200-20 MG/5ML PO SUSP: 30.0000 mL | Freq: Four times a day (QID) | ORAL | Status: DC | PRN

## 2024-09-11 MED ORDER — SODIUM CHLORIDE 0.9 % IV SOLN
250.0000 mL | INTRAVENOUS | Status: AC
  Administered 2024-09-11: 250 mL via INTRAVENOUS

## 2024-09-11 MED ORDER — LIDOCAINE 2% (20 MG/ML) 5 ML SYRINGE
INTRAMUSCULAR | Status: AC
  Filled 2024-09-11: qty 5

## 2024-09-11 MED ORDER — HYDROCODONE-ACETAMINOPHEN 5-325 MG PO TABS: 2.0000 | ORAL_TABLET | ORAL | Status: DC | PRN

## 2024-09-11 MED ORDER — HYDROMORPHONE HCL 1 MG/ML IJ SOLN
0.2500 mg | INTRAMUSCULAR | Status: DC | PRN
  Administered 2024-09-11 (×4): 0.5 mg via INTRAVENOUS

## 2024-09-11 MED ORDER — OXYCODONE HCL 5 MG/5ML PO SOLN: 5.0000 mg | Freq: Once | ORAL | Status: AC | PRN

## 2024-09-11 MED ORDER — CALCIUM CARBONATE ANTACID 500 MG PO CHEW
400.0000 mg | CHEWABLE_TABLET | Freq: Three times a day (TID) | ORAL | Status: DC | PRN
  Administered 2024-09-12 – 2024-09-13 (×4): 400 mg via ORAL
  Filled 2024-09-11 (×5): qty 2

## 2024-09-11 MED ORDER — HYDROMORPHONE HCL 1 MG/ML IJ SOLN
INTRAMUSCULAR | Status: AC
  Filled 2024-09-11: qty 1

## 2024-09-11 MED ORDER — ACETAMINOPHEN 500 MG PO TABS
1000.0000 mg | ORAL_TABLET | Freq: Four times a day (QID) | ORAL | Status: DC
  Administered 2024-09-12 – 2024-09-13 (×5): 1000 mg via ORAL
  Filled 2024-09-11 (×6): qty 2

## 2024-09-11 MED ORDER — LIDOCAINE 2% (20 MG/ML) 5 ML SYRINGE
INTRAMUSCULAR | Status: DC | PRN
  Administered 2024-09-11: 100 mg via INTRAVENOUS

## 2024-09-11 MED ORDER — OXYCODONE HCL 5 MG PO TABS
10.0000 mg | ORAL_TABLET | ORAL | Status: DC | PRN
  Administered 2024-09-11 – 2024-09-12 (×3): 15 mg via ORAL
  Administered 2024-09-12: 10 mg via ORAL
  Administered 2024-09-12 – 2024-09-14 (×4): 15 mg via ORAL
  Filled 2024-09-11 (×9): qty 3

## 2024-09-11 MED ORDER — GABAPENTIN 300 MG PO CAPS: 300.0000 mg | ORAL_CAPSULE | Freq: Three times a day (TID) | ORAL | Status: DC

## 2024-09-11 MED ORDER — 0.9 % SODIUM CHLORIDE (POUR BTL) OPTIME
TOPICAL | Status: DC | PRN
  Administered 2024-09-11: 1000 mL

## 2024-09-11 MED ORDER — DEXAMETHASONE SODIUM PHOSPHATE 10 MG/ML IJ SOLN
10.0000 mg | Freq: Four times a day (QID) | INTRAMUSCULAR | Status: DC
  Administered 2024-09-11 – 2024-09-14 (×11): 10 mg via INTRAVENOUS
  Filled 2024-09-11 (×11): qty 1

## 2024-09-11 MED ORDER — EPHEDRINE 5 MG/ML INJ
INTRAVENOUS | Status: AC
  Filled 2024-09-11: qty 5

## 2024-09-11 MED ORDER — OXYCODONE HCL 5 MG PO TABS
15.0000 mg | ORAL_TABLET | ORAL | Status: DC | PRN
  Administered 2024-09-11: 15 mg via ORAL
  Filled 2024-09-11: qty 3

## 2024-09-11 MED ORDER — CYCLOBENZAPRINE HCL 10 MG PO TABS
10.0000 mg | ORAL_TABLET | Freq: Three times a day (TID) | ORAL | Status: DC | PRN
  Administered 2024-09-11 – 2024-09-13 (×5): 10 mg via ORAL
  Filled 2024-09-11 (×5): qty 1

## 2024-09-11 MED ORDER — PHENYLEPHRINE 80 MCG/ML (10ML) SYRINGE FOR IV PUSH (FOR BLOOD PRESSURE SUPPORT)
PREFILLED_SYRINGE | INTRAVENOUS | Status: DC | PRN
  Administered 2024-09-11 (×2): 80 ug via INTRAVENOUS

## 2024-09-11 MED ORDER — ONDANSETRON HCL 4 MG/2ML IJ SOLN
INTRAMUSCULAR | Status: DC | PRN
Start: 2024-09-11 — End: 2024-09-11
  Administered 2024-09-11: 4 mg via INTRAVENOUS

## 2024-09-11 MED ORDER — ACETAMINOPHEN 500 MG PO TABS: 1000.0000 mg | ORAL_TABLET | Freq: Four times a day (QID) | ORAL | Status: DC | PRN

## 2024-09-11 MED ORDER — LACTATED RINGERS IV SOLN: INTRAVENOUS | Status: DC

## 2024-09-11 MED ORDER — SUGAMMADEX SODIUM 200 MG/2ML IV SOLN
INTRAVENOUS | Status: DC | PRN
  Administered 2024-09-11: 200 mg via INTRAVENOUS
  Administered 2024-09-11: 100 mg via INTRAVENOUS

## 2024-09-11 MED ORDER — FENTANYL CITRATE (PF) 250 MCG/5ML IJ SOLN
INTRAMUSCULAR | Status: DC | PRN
  Administered 2024-09-11 (×5): 50 ug via INTRAVENOUS

## 2024-09-11 MED ORDER — ONDANSETRON HCL 4 MG/2ML IJ SOLN
INTRAMUSCULAR | Status: AC
  Filled 2024-09-11: qty 2

## 2024-09-11 MED ORDER — HYDROMORPHONE HCL 1 MG/ML IJ SOLN
0.5000 mg | INTRAMUSCULAR | Status: DC | PRN
  Administered 2024-09-11 – 2024-09-14 (×13): 0.5 mg via INTRAVENOUS
  Filled 2024-09-11 (×13): qty 0.5

## 2024-09-11 MED ORDER — PHENOL 1.4 % MT LIQD
1.0000 | OROMUCOSAL | Status: DC | PRN
  Filled 2024-09-11: qty 177

## 2024-09-11 MED ORDER — MENTHOL 3 MG MT LOZG: 1.0000 | LOZENGE | OROMUCOSAL | Status: DC | PRN

## 2024-09-11 MED ORDER — SODIUM CHLORIDE 0.9% FLUSH
3.0000 mL | Freq: Two times a day (BID) | INTRAVENOUS | Status: DC
Start: 2024-09-11 — End: 2024-09-14
  Administered 2024-09-11 – 2024-09-12 (×4): 3 mL via INTRAVENOUS

## 2024-09-11 MED ORDER — OXYCODONE HCL 5 MG PO TABS
5.0000 mg | ORAL_TABLET | Freq: Once | ORAL | Status: AC | PRN
  Administered 2024-09-11: 5 mg via ORAL

## 2024-09-11 MED ORDER — ALBUMIN HUMAN 5 % IV SOLN: INTRAVENOUS | Status: DC | PRN

## 2024-09-11 MED ORDER — BUPIVACAINE LIPOSOME 1.3 % IJ SUSP
INTRAMUSCULAR | Status: DC | PRN
  Administered 2024-09-11: 20 mL

## 2024-09-11 MED ORDER — THROMBIN 20000 UNITS EX SOLR
CUTANEOUS | Status: AC
  Filled 2024-09-11: qty 20000

## 2024-09-11 MED ORDER — LIDOCAINE-EPINEPHRINE 1 %-1:100000 IJ SOLN
INTRAMUSCULAR | Status: DC | PRN
  Administered 2024-09-11: 10 mL

## 2024-09-11 MED ORDER — CEFAZOLIN SODIUM-DEXTROSE 2-4 GM/100ML-% IV SOLN
2.0000 g | Freq: Three times a day (TID) | INTRAVENOUS | Status: AC
  Administered 2024-09-11 – 2024-09-13 (×6): 2 g via INTRAVENOUS
  Filled 2024-09-11 (×7): qty 100

## 2024-09-11 MED ORDER — PHENYLEPHRINE 80 MCG/ML (10ML) SYRINGE FOR IV PUSH (FOR BLOOD PRESSURE SUPPORT)
PREFILLED_SYRINGE | INTRAVENOUS | Status: AC
  Filled 2024-09-11: qty 10

## 2024-09-11 MED ORDER — ONDANSETRON HCL 4 MG/2ML IJ SOLN
4.0000 mg | Freq: Four times a day (QID) | INTRAMUSCULAR | Status: DC | PRN
  Administered 2024-09-11 – 2024-09-13 (×4): 4 mg via INTRAVENOUS
  Filled 2024-09-11 (×4): qty 2

## 2024-09-11 MED ORDER — DEXMEDETOMIDINE HCL IN NACL 80 MCG/20ML IV SOLN
INTRAVENOUS | Status: DC | PRN
  Administered 2024-09-11: 8 ug via INTRAVENOUS
  Administered 2024-09-11: 12 ug via INTRAVENOUS

## 2024-09-11 MED ORDER — DEXAMETHASONE SODIUM PHOSPHATE 10 MG/ML IJ SOLN
INTRAMUSCULAR | Status: DC | PRN
  Administered 2024-09-11: 10 mg via INTRAVENOUS

## 2024-09-11 MED ORDER — MIDAZOLAM HCL 2 MG/2ML IJ SOLN
INTRAMUSCULAR | Status: AC
  Filled 2024-09-11: qty 2

## 2024-09-11 MED ORDER — THROMBIN 5000 UNITS EX SOLR
OROMUCOSAL | Status: DC | PRN
  Administered 2024-09-11: 5 mL via TOPICAL

## 2024-09-11 MED ORDER — DEXAMETHASONE SODIUM PHOSPHATE 10 MG/ML IJ SOLN
INTRAMUSCULAR | Status: AC
  Filled 2024-09-11: qty 1

## 2024-09-11 MED ORDER — PROPOFOL 10 MG/ML IV BOLUS
INTRAVENOUS | Status: DC | PRN
  Administered 2024-09-11: 160 mg via INTRAVENOUS

## 2024-09-11 MED ORDER — MIDAZOLAM HCL 2 MG/2ML IJ SOLN
INTRAMUSCULAR | Status: DC | PRN
  Administered 2024-09-11: 2 mg via INTRAVENOUS

## 2024-09-11 MED ORDER — PANTOPRAZOLE SODIUM 40 MG IV SOLR: 40.0000 mg | Freq: Every day | INTRAVENOUS | Status: DC

## 2024-09-11 MED ORDER — ALBUTEROL SULFATE HFA 108 (90 BASE) MCG/ACT IN AERS
INHALATION_SPRAY | RESPIRATORY_TRACT | Status: AC
  Filled 2024-09-11: qty 6.7

## 2024-09-11 MED ORDER — ACETAMINOPHEN 10 MG/ML IV SOLN
1000.0000 mg | Freq: Four times a day (QID) | INTRAVENOUS | Status: DC
  Filled 2024-09-11 (×2): qty 100

## 2024-09-11 MED ORDER — ALBUTEROL SULFATE (2.5 MG/3ML) 0.083% IN NEBU: 2.5000 mg | INHALATION_SOLUTION | Freq: Four times a day (QID) | RESPIRATORY_TRACT | Status: DC | PRN

## 2024-09-11 MED ORDER — THROMBIN 20000 UNITS EX SOLR
CUTANEOUS | Status: DC | PRN
  Administered 2024-09-11: 20 mL via TOPICAL

## 2024-09-11 MED ORDER — BUPIVACAINE HCL (PF) 0.25 % IJ SOLN
INTRAMUSCULAR | Status: AC
  Filled 2024-09-11: qty 30

## 2024-09-11 MED ORDER — PANTOPRAZOLE SODIUM 40 MG PO TBEC
40.0000 mg | DELAYED_RELEASE_TABLET | Freq: Every day | ORAL | Status: DC
  Administered 2024-09-12: 40 mg via ORAL
  Filled 2024-09-11: qty 1

## 2024-09-11 MED ORDER — FLUTICASONE PROPIONATE 50 MCG/ACT NA SUSP: 1.0000 | Freq: Every day | NASAL | Status: DC | PRN

## 2024-09-11 MED ORDER — OXYCODONE HCL 5 MG PO TABS
ORAL_TABLET | ORAL | Status: AC
  Filled 2024-09-11: qty 1

## 2024-09-11 MED ORDER — LIDOCAINE-EPINEPHRINE 1 %-1:100000 IJ SOLN
INTRAMUSCULAR | Status: AC
  Filled 2024-09-11: qty 1

## 2024-09-11 MED ORDER — ADHERUS DURAL SEALANT
PACK | TOPICAL | Status: DC | PRN
  Administered 2024-09-11: 1 via TOPICAL

## 2024-09-11 MED ORDER — DEXMEDETOMIDINE HCL IN NACL 80 MCG/20ML IV SOLN
INTRAVENOUS | Status: AC
  Filled 2024-09-11: qty 20

## 2024-09-11 MED ORDER — CHLORHEXIDINE GLUCONATE 0.12 % MT SOLN
15.0000 mL | Freq: Once | OROMUCOSAL | Status: AC
  Administered 2024-09-11: 15 mL via OROMUCOSAL
  Filled 2024-09-11: qty 15

## 2024-09-11 MED ORDER — HYDROXYZINE HCL 25 MG PO TABS
25.0000 mg | ORAL_TABLET | Freq: Four times a day (QID) | ORAL | Status: DC | PRN
  Administered 2024-09-11 – 2024-09-13 (×3): 25 mg via ORAL
  Filled 2024-09-11 (×3): qty 1

## 2024-09-11 SURGICAL SUPPLY — 64 items
BAG COUNTER SPONGE SURGICOUNT (BAG) ×1 IMPLANT
BASKET BONE COLLECTION (BASKET) ×1 IMPLANT
BENZOIN TINCTURE PRP APPL 2/3 (GAUZE/BANDAGES/DRESSINGS) ×1 IMPLANT
BLADE BONE MILL MEDIUM (MISCELLANEOUS) ×1 IMPLANT
BLADE CLIPPER SURG (BLADE) IMPLANT
BLADE SURG 11 STRL SS (BLADE) ×1 IMPLANT
BUR CUTTER 7.0 ROUND (BURR) ×1 IMPLANT
BUR MATCHSTICK NEURO 3.0 LAGG (BURR) ×1 IMPLANT
CANISTER SUCTION 3000ML PPV (SUCTIONS) ×1 IMPLANT
CAP LOCKING 5.5 CREO (Cap) IMPLANT
CNTNR URN SCR LID CUP LEK RST (MISCELLANEOUS) ×1 IMPLANT
COVER BACK TABLE 60X90IN (DRAPES) ×1 IMPLANT
DERMABOND ADVANCED .7 DNX12 (GAUZE/BANDAGES/DRESSINGS) ×1 IMPLANT
DRAPE C-ARM 42X72 X-RAY (DRAPES) ×2 IMPLANT
DRAPE C-ARMOR (DRAPES) IMPLANT
DRAPE HALF SHEET 40X57 (DRAPES) IMPLANT
DRAPE LAPAROTOMY 100X72X124 (DRAPES) ×1 IMPLANT
DRAPE MICROSCOPE LEICA (MISCELLANEOUS) IMPLANT
DRAPE SURG 17X23 STRL (DRAPES) ×1 IMPLANT
DRSG OPSITE 4X5.5 SM (GAUZE/BANDAGES/DRESSINGS) ×1 IMPLANT
DRSG OPSITE POSTOP 4X6 (GAUZE/BANDAGES/DRESSINGS) ×1 IMPLANT
DURAPREP 26ML APPLICATOR (WOUND CARE) ×1 IMPLANT
ELECTRODE REM PT RTRN 9FT ADLT (ELECTROSURGICAL) ×1 IMPLANT
EVACUATOR 1/8 PVC DRAIN (DRAIN) ×1 IMPLANT
GAUZE 4X4 16PLY ~~LOC~~+RFID DBL (SPONGE) IMPLANT
GAUZE SPONGE 4X4 12PLY STRL (GAUZE/BANDAGES/DRESSINGS) ×1 IMPLANT
GLOVE BIO SURGEON STRL SZ7 (GLOVE) IMPLANT
GLOVE BIO SURGEON STRL SZ8 (GLOVE) ×2 IMPLANT
GLOVE BIOGEL PI IND STRL 7.0 (GLOVE) IMPLANT
GLOVE EXAM NITRILE XL STR (GLOVE) IMPLANT
GLOVE INDICATOR 8.5 STRL (GLOVE) ×4 IMPLANT
GOWN STRL REUS W/ TWL LRG LVL3 (GOWN DISPOSABLE) IMPLANT
GOWN STRL REUS W/ TWL XL LVL3 (GOWN DISPOSABLE) ×2 IMPLANT
GOWN STRL REUS W/TWL 2XL LVL3 (GOWN DISPOSABLE) IMPLANT
GRAFT BNE MATRIX VG FRMBL MD 5 (Bone Implant) IMPLANT
GRAFT DURAGEN MATRIX 1WX1L (Tissue) IMPLANT
HEMOSTAT POWDER KIT SURGIFOAM (HEMOSTASIS) IMPLANT
KIT BASIN OR (CUSTOM PROCEDURE TRAY) ×1 IMPLANT
KIT TURNOVER KIT B (KITS) ×1 IMPLANT
MILL BONE PREP (MISCELLANEOUS) ×1 IMPLANT
NDL HYPO 21X1.5 SAFETY (NEEDLE) ×1 IMPLANT
NDL HYPO 25X1 1.5 SAFETY (NEEDLE) ×1 IMPLANT
NEEDLE HYPO 21X1.5 SAFETY (NEEDLE) ×1 IMPLANT
NEEDLE HYPO 25X1 1.5 SAFETY (NEEDLE) ×1 IMPLANT
NS IRRIG 1000ML POUR BTL (IV SOLUTION) ×1 IMPLANT
PACK LAMINECTOMY NEURO (CUSTOM PROCEDURE TRAY) ×1 IMPLANT
PAD ARMBOARD POSITIONER FOAM (MISCELLANEOUS) ×3 IMPLANT
ROD SPINAL CREO 95MM (Rod) IMPLANT
SEALANT ADHERUS EXTEND TIP (MISCELLANEOUS) IMPLANT
SHAFT CREO 30MM (Neuro Prosthesis/Implant) IMPLANT
SPACER SUSTAIN TI 9X22X12 15D (Spacer) IMPLANT
SPIKE FLUID TRANSFER (MISCELLANEOUS) ×1 IMPLANT
SPONGE SURGIFOAM ABS GEL 100 (HEMOSTASIS) ×1 IMPLANT
SPONGE T-LAP 4X18 ~~LOC~~+RFID (SPONGE) IMPLANT
STRIP CLOSURE SKIN 1/2X4 (GAUZE/BANDAGES/DRESSINGS) ×2 IMPLANT
SUT PROLENE 6 0 BV (SUTURE) IMPLANT
SUT VIC AB 0 CT1 18XCR BRD8 (SUTURE) ×2 IMPLANT
SUT VIC AB 2-0 CT1 18 (SUTURE) ×1 IMPLANT
SUT VIC AB 4-0 PS2 27 (SUTURE) ×1 IMPLANT
SYR 20ML LL LF (SYRINGE) IMPLANT
TOWEL GREEN STERILE (TOWEL DISPOSABLE) ×1 IMPLANT
TOWEL GREEN STERILE FF (TOWEL DISPOSABLE) ×1 IMPLANT
TRAY FOLEY MTR SLVR 16FR STAT (SET/KITS/TRAYS/PACK) ×1 IMPLANT
WATER STERILE IRR 1000ML POUR (IV SOLUTION) ×1 IMPLANT

## 2024-09-11 NOTE — H&P (Signed)
 Brent Reed is an 48 y.o. male.   Chief Complaint: Back and right greater than left leg pain HPI: 48 year old gentleman status post L4-S1 fusion back about 10 years ago patient did very well however last several weeks to months had progressive worsening back and right greater than left leg pain workup has revealed segmental degeneration above his fusion at L3-4 with severe spinal stenosis at that level.  As well as evidence of instability.  He failed all forms of conservative treatment with anti-inflammatories injections physical therapy and due to the patient's progression of clinical syndrome imaging findings of failed conservative treatment I recommended extension of his fusion with decompressive laminectomies and interbody fusion at L3-4.  I have extensively gone over the risks and benefits of that operation with him as well as perioperative course expectations of outcome and alternatives to surgery and he understands and agrees to proceed forward.  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   Sciatica    Seizures (HCC)    previously on tegretol , thought to be secondary to Ultram on one occurance   Seizures (HCC)    last 2-08  from medication    Past Surgical History:  Procedure Laterality Date   APPENDECTOMY      as teen   BACK SURGERY  05/15/2003, 07/13/2014   x 2    COLONOSCOPY     COLONOSCOPY WITH ESOPHAGOGASTRODUODENOSCOPY (EGD)     Last done in [facility]   pyloric stenosis     49 weeks old   UPPER GASTROINTESTINAL ENDOSCOPY      Family History  Problem Relation Age of Onset   Healthy Mother    Healthy Father    Diabetes Maternal Grandmother    Heart disease Maternal Grandmother    Colon cancer Neg Hx    Esophageal  cancer Neg Hx    Rectal cancer Neg Hx    Stomach cancer Neg Hx    Social History:  reports that he has been smoking cigarettes. He has a 19 pack-year smoking history. He has never used smokeless tobacco. He reports that he does not drink alcohol and does not use drugs.  Allergies:  Allergies  Allergen Reactions   Bupropion Other (See Comments)    seizures  seizures  REACTION: Seizures   Ultram [Tramadol] Other (See Comments)    seizures   Dicyclomine Rash   Gabapentin  Other (See Comments)    Mood change- suicidal thought   Sulfamethoxazole-Trimethoprim Rash    REACTION: rash    Medications Prior to Admission  Medication Sig Dispense Refill   acetaminophen  (TYLENOL ) 500 MG tablet Take 1,000 mg by mouth every 6 (six) hours as needed for moderate pain (pain score 4-6) or headache.     albuterol  (VENTOLIN  HFA) 108 (90 Base) MCG/ACT inhaler Inhale 1-2 puffs into the lungs every 6 (six) hours as needed for wheezing or shortness of breath.     hydrOXYzine  (ATARAX ) 25 MG tablet Take 1 tablet (25 mg total) by mouth every 6 (six) hours as needed for itching. 12 tablet 0   omeprazole  (PRILOSEC) 40 MG capsule Take 1 capsule (40  mg total) by mouth 2 (two) times daily before a meal. Two times daily for 8 weeks, then decrease back to daily (Patient taking differently: Take 40 mg by mouth daily.) 56 capsule 0   fluticasone  (FLONASE ) 50 MCG/ACT nasal spray Place 1 spray into both nostrils daily as needed for allergies.      No results found for this or any previous visit (from the past 48 hours). No results found.  Review of Systems  Musculoskeletal:  Positive for back pain.  Neurological:  Positive for numbness.    Blood pressure 139/84, pulse (!) 53, temperature 98.1 F (36.7 C), temperature source Oral, resp. rate 20, height 5' 10 (1.778 m), weight 89.4 kg, SpO2 95%. Physical Exam HENT:     Head: Normocephalic.     Right Ear: Tympanic membrane normal.     Nose: Nose normal.   Cardiovascular:     Rate and Rhythm: Normal rate.     Pulses: Normal pulses.  Pulmonary:     Effort: Pulmonary effort is normal.  Musculoskeletal:        General: Normal range of motion.     Cervical back: Normal range of motion.  Skin:    General: Skin is warm.  Neurological:     Mental Status: He is alert.     Comments: Strength is 5 out of 5 iliopsoas, quads, hamstrings, gastrocs, into tibialis, EHL.      Assessment/Plan 48 years old and presents for decompressive laminectomy interbody fusion L3-4 with removal of hardware L4-S1.  Arley SHAUNNA Helling, MD 09/11/2024, 7:16 AM

## 2024-09-11 NOTE — Anesthesia Postprocedure Evaluation (Signed)
 Anesthesia Post Note  Patient: Brent Reed  Procedure(s) Performed: POSTERIOR LUMBAR FUSION LUMBAR THREE-FOUR WITH HARDWARE REMOVAL (Back)     Patient location during evaluation: PACU Anesthesia Type: General Level of consciousness: awake and alert Pain management: pain level controlled Vital Signs Assessment: post-procedure vital signs reviewed and stable Respiratory status: spontaneous breathing, nonlabored ventilation and respiratory function stable Cardiovascular status: blood pressure returned to baseline and stable Postop Assessment: no apparent nausea or vomiting Anesthetic complications: no   No notable events documented.  Last Vitals:  Vitals:   09/11/24 1315 09/11/24 1330  BP: (!) 153/94 (!) 140/94  Pulse: 72 69  Resp: (!) 26 12  Temp:    SpO2: 97% 96%    Last Pain:  Vitals:   09/11/24 1330  TempSrc:   PainSc: 5                  Gabryelle Whitmoyer,W. EDMOND

## 2024-09-11 NOTE — Op Note (Signed)
 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  Postoperative diagnosis: Same.  Procedure: #1 decompressive laminectomy L3-4 with complete medial facetectomies radical foraminotomies of the L3-L4 nerve roots bilaterally with removal of the pars and aggressive antibiotic the superior tickling facet in excess and requiring more work than would be needed with a standard interbody fusion.  2.  Posterior lumbar to body fusion utilizing the globus titanium cages packed with locally harvested autograft mixed with Vivigen.  3.  Cortical screw fixation L3-4 utilizing the globus modular cortical screw set with the locking caps from the older set.  4.  Posterolateral arthrodesis L3-4 utilizing locally harvested autograft mixed with Vivigen.  5.  Expiration fusion move of hardware with removal of the knots removal of the wire rods from L4-S1 and extension with 1 long rod from L3-S1.  Surgeon: Arley helling.  Assistant: Suzen Click.  Anesthesia: General.  EBL: 200.  Complications: Inadvertent dural tear.  HPI: 48 year old gentleman previous L4-S1 fusion did very well over the last several weeks and months had progressive worsening back and bilateral leg pain worse on the right workup revealed severe degenerative disc disease spinal stenosis L3-4 with marked facet neuropathy and motion.  Due to the patient's progression of clinical syndrome imaging findings failed conservative treatment I recommended decompressive laminectomy interbody fusion at that level.  I extensively reviewed the risks and benefits of the operation with patient as well as perioperative course expectations of outcome and alternatives to surgery and he understood and agreed to proceed forward.  Operative procedure: 48 year old gentleman was induced under general anesthesia positioned prone on the Wilson frame his back was prepped and draped in routine sterile fashion his old incision was  infiltrated and extended cephalad and subperiosteal dissection was carried out on the lamina of L3 exposing the cortical screw entry point at L3 and exposing the hardware from L4-S1.  I then remove the spinous process at L3 began the central decompression drilled down the facet joints performed a complete central decompression complete medial facetectomies remove the pars and disarticulated facet performed radical foraminotomies of the L3 and L4 nerve roots with aggressive under biting of both super reticulating facets.  Then the space was identified epidural veins were coagulated the space was incised and cleaned out bilaterally.  During the discectomy on the right side while we are freeing up some central disc the dura was densely adherent to the annulus and during cleaning out the central disc and neural retraction the dura was torn into the axilla of the L3 nerve root.  This was packed away we finished the discectomy finish the endplate preparation inserted the cages bilaterally placed both cortical screws under fluoroscopy.  All screws had excellent purchase fluoroscopy confirmed that the left screw might have breach the medial aspect of the pedicle so this was repositioned superior lateral under fluoroscopy and the replacement screw was in good position.  Fluoroscopy confirmed good position of all the implants ultimately.  Then ball the foramina were inspected to confirm patency and no migration of graft material Gelfoam was overlaid on top of the dura on the patient's left side and I inspected the dural tear on the right.  Nerve root appeared to be intact however part of the nerve root sleeve look like it had been torn in addition to the lateral aspect of the dura so then I prepped the operating microscope and under microscopic illumination inspected all neural elements appear to be intact they will head up arachnoid tear  and on the lateral aspect the dura at the level the axilla I was able to put 1 Prolene  stitch to reapproximate that there was no spinal fluid leaking from the nerve root sleeve so I took a piece of DuraGen and packed it along the nerve root sleeve and along the lateral aspect the dura we were unable to repair any additional tears primarily with suture.  There was no leaking after that.  After the piece of DuraGen I placed a green fibrin glue on top of the Gelfoam and some additional fibrin glue and packed that away.  At this point all the knots were removed rods were removed heads were assembled on the L3 screws rods were sized and shaped up and inserted everything was anchored in place.  Gelfoam was further laid on top of the dura meticulous hemostasis was maintained everything was anchored in place and closed primarily with interrupted Vicryl and the fascia and muscle and subcutaneous suture and the skin was closed with a running subcuticular.  Dermabond benzoin Steri-Strips and sterile dressing was applied patient to cover him in stable condition.  At the end the case all needle count sponge counts were correct.

## 2024-09-11 NOTE — Progress Notes (Signed)
 Orthopedic Tech Progress Note Patient Details:  Brent Reed [birth year] [id number]  Ortho Devices Type of Ortho Device: Lumbar corsett Ortho Device/Splint Location: BACK Ortho Device/Splint Interventions: Ordered   Post Interventions Patient Tolerated: Well Instructions Provided: Care of device  [name] Pac 09/11/2024, 12:37 PM

## 2024-09-11 NOTE — Plan of Care (Signed)

## 2024-09-11 NOTE — Transfer of Care (Signed)
 Immediate Anesthesia Transfer of Care Note  Patient: Brent Reed  Procedure(s) Performed: POSTERIOR LUMBAR FUSION LUMBAR THREE-FOUR WITH HARDWARE REMOVAL (Back)  Patient Location: PACU  Anesthesia Type:General  Level of Consciousness: awake, alert , patient cooperative, and responds to stimulation  Airway & Oxygen Therapy: Patient Spontanous Breathing and Patient connected to face mask oxygen  Post-op Assessment: Report given to RN, Post -op Vital signs reviewed and stable, and Patient moving all extremities X 4  Post vital signs: Reviewed and stable  Last Vitals:  Vitals Value Taken Time  BP 163/99 09/11/24 11:22  Temp 37.3 C 09/11/24 11:22  Pulse 81 09/11/24 11:27  Resp 24 09/11/24 11:27  SpO2 100 % 09/11/24 11:27  Vitals shown include unfiled device data.  Last Pain:  Vitals:   09/11/24 0622  TempSrc:   PainSc: 8       Patients Stated Pain Goal: 3 (09/11/24 0622)  Complications: No notable events documented.

## 2024-09-11 NOTE — Anesthesia Procedure Notes (Addendum)
 Procedure Name: Intubation Date/Time: 09/11/2024 7:42 AM  Performed by: Jolynn Mage, CRNAPre-anesthesia Checklist: Patient identified, Patient being monitored, Timeout performed, Emergency Drugs available and Suction available Patient Re-evaluated:Patient Re-evaluated prior to induction Oxygen Delivery Method: Circle System Utilized Preoxygenation: Pre-oxygenation with 100% oxygen Induction Type: IV induction Ventilation: Mask ventilation without difficulty and Oral airway inserted - appropriate to patient size Laryngoscope Size: Cleotilde and 2 Grade View: Grade I Tube type: Oral Tube size: 7.5 mm Number of attempts: 1 Airway Equipment and Method: Stylet Placement Confirmation: ETT inserted through vocal cords under direct vision, positive ETCO2 and breath sounds checked- equal and bilateral Secured at: 23 cm Tube secured with: Tape Dental Injury: Teeth and Oropharynx as per pre-operative assessment

## 2024-09-12 MED ORDER — HYDROXYZINE HCL 50 MG/ML IM SOLN
50.0000 mg | Freq: Four times a day (QID) | INTRAMUSCULAR | Status: DC | PRN
  Administered 2024-09-12 – 2024-09-13 (×3): 50 mg via INTRAMUSCULAR
  Filled 2024-09-12 (×3): qty 1

## 2024-09-12 MED ORDER — CHLORHEXIDINE GLUCONATE CLOTH 2 % EX PADS
6.0000 | MEDICATED_PAD | Freq: Every day | CUTANEOUS | Status: DC
  Administered 2024-09-12 – 2024-09-13 (×2): 6 via TOPICAL

## 2024-09-12 MED ORDER — PANTOPRAZOLE SODIUM 40 MG IV SOLR
40.0000 mg | Freq: Two times a day (BID) | INTRAVENOUS | Status: DC
  Administered 2024-09-12: 40 mg via INTRAVENOUS
  Filled 2024-09-12 (×2): qty 10

## 2024-09-12 NOTE — Plan of Care (Signed)

## 2024-09-12 NOTE — Progress Notes (Signed)
 Subjective: Patient reports doing well, no headaches. Back pain is better than yesterday   Objective: Vital signs in last 24 hours: Temp:  [97.6 F (36.4 C)-99.5 F (37.5 C)] 98.3 F (36.8 C) (09/16 0730) Pulse Rate:  [62-102] 70 (09/16 0730) Resp:  [9-26] 18 (09/16 0730) BP: (122-163)/(71-99) 128/79 (09/16 0730) SpO2:  [93 %-100 %] 99 % (09/16 0730)  Intake/Output from previous day: 09/15 0701 - 09/16 0700 In: 2770 [P.O.:720; I.V.:1700; IV Piggyback:350] Out: 3050 [Urine:2800; Blood:250] Intake/Output this shift: No intake/output data recorded.  Neurologic: Grossly normal  Lab Results: Lab Results  Component Value Date   WBC 12.9 (H) 09/07/2024   HGB 15.9 09/07/2024   HCT 45.0 09/07/2024   MCV 88.1 09/07/2024   PLT 321 09/07/2024   No results found for: INR, PROTIME BMET Lab Results  Component Value Date   NA 135 09/07/2024   K 3.7 09/07/2024   CL 101 09/07/2024   CO2 24 09/07/2024   GLUCOSE 115 (H) 09/07/2024   BUN 10 09/07/2024   CREATININE 1.08 09/07/2024   CALCIUM  9.1 09/07/2024    Studies/Results: DG Lumbar Spine 2-3 Views Result Date: 09/11/2024 CLINICAL DATA:  Elective surgery. EXAM: LUMBAR SPINE - 2-3 VIEW COMPARISON:  CT of the lumbar spine dated 08/10/2024. FINDINGS: Multiple intraoperative fluoroscopic spot images are provided. Interval posterior fusion and decompression at L3-L4. Partially visualized prior posterior fusion L4-S1. Total fluoroscopy time: 37.4 seconds Total dose: Radiation Exposure Index (as provided by the fluoroscopic device): 31.7 mGy air Kerma Please see intraoperative findings for further detail. IMPRESSION: Intraoperative fluoroscopy, as above. Electronically Signed   By: Harrietta Sherry M.D.   On: 09/11/2024 11:32   DG C-Arm 1-60 Min-No Report Result Date: 09/11/2024 Fluoroscopy was utilized by the requesting physician.  No radiographic interpretation.    Assessment/Plan: Postop day 1 lumbar fusion. We will slowly increase  his head of bed today and see how he does.    LOS: 0 days    Suzen Lacks [facility] 09/12/2024, 7:39 AM

## 2024-09-13 ENCOUNTER — Inpatient Hospital Stay (HOSPITAL_COMMUNITY)

## 2024-09-13 DIAGNOSIS — F17210 Nicotine dependence, cigarettes, uncomplicated: Secondary | ICD-10-CM | POA: Diagnosis present

## 2024-09-13 DIAGNOSIS — F419 Anxiety disorder, unspecified: Secondary | ICD-10-CM | POA: Diagnosis present

## 2024-09-13 DIAGNOSIS — K219 Gastro-esophageal reflux disease without esophagitis: Secondary | ICD-10-CM | POA: Diagnosis present

## 2024-09-13 DIAGNOSIS — I10 Essential (primary) hypertension: Secondary | ICD-10-CM | POA: Diagnosis present

## 2024-09-13 DIAGNOSIS — F32A Depression, unspecified: Secondary | ICD-10-CM | POA: Diagnosis present

## 2024-09-13 DIAGNOSIS — M51362 Other intervertebral disc degeneration, lumbar region with discogenic back pain and lower extremity pain: Secondary | ICD-10-CM | POA: Diagnosis present

## 2024-09-13 DIAGNOSIS — M532X6 Spinal instabilities, lumbar region: Secondary | ICD-10-CM | POA: Diagnosis present

## 2024-09-13 DIAGNOSIS — Z981 Arthrodesis status: Secondary | ICD-10-CM | POA: Diagnosis not present

## 2024-09-13 DIAGNOSIS — Y838 Other surgical procedures as the cause of abnormal reaction of the patient, or of later complication, without mention of misadventure at the time of the procedure: Secondary | ICD-10-CM | POA: Diagnosis not present

## 2024-09-13 DIAGNOSIS — Z882 Allergy status to sulfonamides status: Secondary | ICD-10-CM | POA: Diagnosis not present

## 2024-09-13 DIAGNOSIS — M48061 Spinal stenosis, lumbar region without neurogenic claudication: Secondary | ICD-10-CM | POA: Diagnosis present

## 2024-09-13 DIAGNOSIS — Z8249 Family history of ischemic heart disease and other diseases of the circulatory system: Secondary | ICD-10-CM | POA: Diagnosis not present

## 2024-09-13 DIAGNOSIS — Z79899 Other long term (current) drug therapy: Secondary | ICD-10-CM | POA: Diagnosis not present

## 2024-09-13 DIAGNOSIS — M1990 Unspecified osteoarthritis, unspecified site: Secondary | ICD-10-CM | POA: Diagnosis present

## 2024-09-13 DIAGNOSIS — M5440 Lumbago with sciatica, unspecified side: Secondary | ICD-10-CM | POA: Diagnosis present

## 2024-09-13 DIAGNOSIS — Z833 Family history of diabetes mellitus: Secondary | ICD-10-CM | POA: Diagnosis not present

## 2024-09-13 DIAGNOSIS — G9741 Accidental puncture or laceration of dura during a procedure: Secondary | ICD-10-CM | POA: Diagnosis not present

## 2024-09-13 DIAGNOSIS — Z888 Allergy status to other drugs, medicaments and biological substances status: Secondary | ICD-10-CM | POA: Diagnosis not present

## 2024-09-13 MED ORDER — PANTOPRAZOLE SODIUM 40 MG IV SOLR
40.0000 mg | Freq: Two times a day (BID) | INTRAVENOUS | Status: DC
  Administered 2024-09-13 – 2024-09-14 (×3): 40 mg via INTRAVENOUS
  Filled 2024-09-13 (×2): qty 10

## 2024-09-13 NOTE — Progress Notes (Signed)
 Patient still having N/V, denies H/A. Patient head up to 45 degree. PA notified. KUB ordered. Will continue to monitor.

## 2024-09-13 NOTE — Progress Notes (Signed)
 Progressed  patient's activity from bedrest slowly to activity as tolerated. Patient got out of bed, walked in the hallway and tolerated well with no complaints of headaches. Will continue to monitor.

## 2024-09-14 MED ORDER — OXYCODONE HCL 10 MG PO TABS: 15.0000 mg | ORAL_TABLET | ORAL | 0 refills | Status: AC | PRN

## 2024-09-14 MED ORDER — CYCLOBENZAPRINE HCL 10 MG PO TABS: 10.0000 mg | ORAL_TABLET | Freq: Three times a day (TID) | ORAL | 0 refills | Status: AC | PRN

## 2024-09-14 NOTE — Progress Notes (Signed)
 PT Cancellation Note and Discharge  Patient Details Name: Brent Reed MRN: [id number] DOB: [birth year]   Cancelled Treatment:    Reason Eval/Treat Not Completed: PT screened, no needs identified, will sign off. Discussed pt case with OT who reports pt is currently mobilizing at a modified independent level and does not require a formal PT evaluation at this time. PT signing off. If needs change, please reconsult.     [name] 09/14/2024, 10:16 AM  Leita Sable, PT, DPT Acute Rehabilitation Services Secure Chat Preferred Office: [phone]

## 2024-09-14 NOTE — Discharge Summary (Signed)
 Physician Discharge Summary  Patient ID: Brent Reed MRN: [id number] DOB/AGE: Sep 27, 1976 48 y.o.  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     Discharge Diagnoses: same   Discharged Condition: good  Hospital Course: The patient was admitted on 09/11/2024 and taken to the operating room where the patient underwent  PLIF L3-4. The patient tolerated the procedure well and was taken to the recovery room and then to the floor in stable condition. The hospital course was routine. There were no complications. The wound remained clean dry and intact. Pt had appropriate back soreness. No complaints of leg pain or new N/T/W. The patient remained afebrile with stable vital signs, and tolerated a regular diet. The patient continued to increase activities, and pain was well controlled with oral pain medications.   Consults: None  Significant Diagnostic Studies:  Results for orders placed or performed during the hospital encounter of 09/11/24  ABO/Rh   Collection Time: 09/11/24  7:18 AM  Result Value Ref Range   ABO/RH(D)      O POS Performed at [facility], [address] [place], [place] [address]     DG Abd 1 View Result Date: 09/13/2024 CLINICAL DATA:  Nausea and vomiting. EXAM: ABDOMEN - 1 VIEW COMPARISON:  None Available. FINDINGS: Diffuse air distention of the colon measuring 5.3 cm in caliber. No small bowel dilatation. No free air. Lower lumbar fusion hardware. No acute osseous pathology. IMPRESSION: Diffuse air distention of the colon. Electronically Signed   By: Vanetta Chou M.D.   On: 09/13/2024 18:00   DG Lumbar Spine 2-3 Views Result Date: 09/11/2024 CLINICAL DATA:  Elective surgery. EXAM: LUMBAR SPINE - 2-3 VIEW COMPARISON:  CT of the lumbar spine dated 08/10/2024. FINDINGS: Multiple intraoperative fluoroscopic spot images are provided. Interval posterior fusion and  decompression at L3-L4. Partially visualized prior posterior fusion L4-S1. Total fluoroscopy time: 37.4 seconds Total dose: Radiation Exposure Index (as provided by the fluoroscopic device): 31.7 mGy air Kerma Please see intraoperative findings for further detail. IMPRESSION: Intraoperative fluoroscopy, as above. Electronically Signed   By: Harrietta Sherry M.D.   On: 09/11/2024 11:32   DG C-Arm 1-60 Min-No Report Result Date: 09/11/2024 Fluoroscopy was utilized by the requesting physician.  No radiographic interpretation.    Antibiotics:  Anti-infectives (From admission, onward)    Start     Dose/Rate Route Frequency Ordered Stop   09/11/24 1230  ceFAZolin  (ANCEF ) IVPB 2g/100 mL premix        2 g 200 mL/hr over 30 Minutes Intravenous Every 8 hours 09/11/24 1141 09/13/24 0910   09/11/24 0600  ceFAZolin  (ANCEF ) IVPB 2g/100 mL premix        2 g 200 mL/hr over 30 Minutes Intravenous On call to O.R. 09/11/24 0552 09/11/24 0815       Discharge Exam: Blood pressure (!) 155/93, pulse 80, temperature 98.5 F (36.9 C), resp. rate 18, height 5' 10 (1.778 m), weight 89.4 kg, SpO2 98%. Neurologic: Grossly normal Ambulating and voiding well incision cdi   Discharge Medications:   Allergies as of 09/14/2024       Reactions   Bupropion Other (See Comments)   seizures seizures  REACTION: Seizures   Ultram [tramadol] Other (See Comments)   seizures   Dicyclomine Rash   Gabapentin  Other (See Comments)   Mood change- suicidal thought   Sulfamethoxazole-trimethoprim Rash   REACTION: rash        Medication  List     TAKE these medications    acetaminophen  500 MG tablet Commonly known as: TYLENOL  Take 1,000 mg by mouth every 6 (six) hours as needed for moderate pain (pain score 4-6) or headache.   albuterol  108 (90 Base) MCG/ACT inhaler Commonly known as: VENTOLIN  HFA Inhale 1-2 puffs into the lungs every 6 (six) hours as needed for wheezing or shortness of breath.    cyclobenzaprine  10 MG tablet Commonly known as: FLEXERIL  Take 1 tablet (10 mg total) by mouth 3 (three) times daily as needed for muscle spasms.   fluticasone  50 MCG/ACT nasal spray Commonly known as: FLONASE  Place 1 spray into both nostrils daily as needed for allergies.   hydrOXYzine  25 MG tablet Commonly known as: ATARAX  Take 1 tablet (25 mg total) by mouth every 6 (six) hours as needed for itching.   omeprazole  40 MG capsule Commonly known as: PRILOSEC Take 1 capsule (40 mg total) by mouth 2 (two) times daily before a meal. Two times daily for 8 weeks, then decrease back to daily What changed:  when to take this additional instructions   Oxycodone  HCl 10 MG Tabs Take 1.5 tablets (15 mg total) by mouth every 4 (four) hours as needed for severe pain (pain score 7-10).        Disposition: home   Final Dx: PLIF L3-4  Discharge Instructions      Remove dressing in 72 hours   Complete by: As directed    Call MD for:   Complete by: As directed    Call MD for:  difficulty breathing, headache or visual disturbances   Complete by: As directed    Call MD for:  hives   Complete by: As directed    Call MD for:  persistant nausea and vomiting   Complete by: As directed    Call MD for:  redness, tenderness, or signs of infection (pain, swelling, redness, odor or green/yellow discharge around incision site)   Complete by: As directed    Call MD for:  severe uncontrolled pain   Complete by: As directed    Call MD for:  temperature >100.4   Complete by: As directed    Diet - low sodium heart healthy   Complete by: As directed    Driving Restrictions   Complete by: As directed    No driving for 2 weeks, no riding in the car for 1 week   Increase activity slowly   Complete by: As directed           Signed: Suzen Lacks Xylina Rhoads 09/14/2024, 8:01 AM

## 2024-09-14 NOTE — Evaluation (Signed)
 Occupational Therapy Evaluation Patient Details Name: Brent Reed MRN: [id number] DOB: Nov 13, 1976 Today's Date: 09/14/2024   History of Present Illness   48 yo M s/p PLIF.  Prior PLIF.  Depression, GERD, HTN.     Clinical Impressions Patient adm for the procedure above.  PTA he lives at home with his daughter, who can assist as needed.  Patient is very close to baseline for in room mobility and ADL completion at sit to stand level.  Post op discomfort is the primary deficit.  Patient with good understanding of precautions, and no further OT needs in the acute setting.  Follow up with MD as prescribed.        If plan is discharge home, recommend the following:   Assist for transportation     Functional Status Assessment   Patient has not had a recent decline in their functional status     Equipment Recommendations   None recommended by OT     Recommendations for Other Services         Precautions/Restrictions   Precautions Precautions: Back Precaution Booklet Issued: Yes (comment) Recall of Precautions/Restrictions: Intact Required Braces or Orthoses: Spinal Brace Spinal Brace: Lumbar corset Restrictions Weight Bearing Restrictions Per Provider Order: No     Mobility Bed Mobility Overal bed mobility: Modified Independent                  Transfers Overall transfer level: Modified independent Equipment used: None                      Balance Overall balance assessment: Mild deficits observed, not formally tested                                         ADL either performed or assessed with clinical judgement   ADL Overall ADL's : Modified independent                                             Vision Patient Visual Report: No change from baseline       Perception Perception: Not tested       Praxis Praxis: Not tested       Pertinent Vitals/Pain Pain Assessment Pain Assessment:  Faces Faces Pain Scale: Hurts little more Pain Location: Incisional Pain Descriptors / Indicators: Sore Pain Intervention(s): Premedicated before session     Extremity/Trunk Assessment Upper Extremity Assessment Upper Extremity Assessment: Overall WFL for tasks assessed   Lower Extremity Assessment Lower Extremity Assessment: Overall WFL for tasks assessed   Cervical / Trunk Assessment Cervical / Trunk Assessment: Back Surgery   Communication Communication Communication: No apparent difficulties   Cognition Arousal: Alert Behavior During Therapy: WFL for tasks assessed/performed Cognition: No apparent impairments                               Following commands: Intact        Cueing Techniques: Verbal cues                 Home Living Family/patient expects to be discharged to:: Private residence Living Arrangements: Children Available Help at Discharge: Family;Available PRN/intermittently Type of Home: House Home Access: Stairs to enter [employer] of  Steps: 4 Entrance Stairs-Rails: Right Home Layout: One level     Bathroom Shower/Tub: Tub/shower unit;Walk-in shower   Bathroom Toilet: Handicapped height Bathroom Accessibility: Yes How Accessible: Accessible via walker Home Equipment: None          Prior Functioning/Environment Prior Level of Function : Independent/Modified Independent                    OT Problem List: Pain   OT Treatment/Interventions:        OT Goals(Current goals can be found in the care plan section)   Acute Rehab OT Goals Patient Stated Goal: Return home OT Goal Formulation: With patient Time For Goal Achievement: 09/18/24 Potential to Achieve Goals: Good   OT Frequency:       Co-evaluation              AM-PAC OT 6 Clicks Daily Activity     Outcome Measure Help from another person eating meals?: None Help from another person taking care of personal grooming?: None Help  from another person toileting, which includes using toliet, bedpan, or urinal?: None Help from another person bathing (including washing, rinsing, drying)?: None Help from another person to put on and taking off regular upper body clothing?: None Help from another person to put on and taking off regular lower body clothing?: None 6 Click Score: 24   End of Session Nurse Communication: Mobility status  Activity Tolerance: Patient tolerated treatment well Patient left: in chair;with call bell/phone within reach  OT Visit Diagnosis: Unsteadiness on feet (R26.81)                Time: 9160-9140 OT Time Calculation (min): 20 min Charges:  OT General Charges $OT Visit: 1 Visit OT Evaluation $OT Eval Moderate Complexity: 1 Mod  09/14/2024  RP, OTR/L  Acute Rehabilitation Services  Office:  [phone]   Charlie JONETTA Halsted 09/14/2024, 9:04 AM

## 2024-09-14 NOTE — Progress Notes (Signed)
 Patient alert and oriented, voided, ambulate no c/o H/A or dizziness, denies nausea. Surgical site clean and dry no sign of infection. D/c instructions explain and given all questions answered.
